# Patient Record
Sex: Male | Born: 1950 | Race: White | Hispanic: No | Marital: Married | Smoking: Former smoker
Health system: Southern US, Community
[De-identification: ages and names within clinical notes are randomized; demographics above are authoritative.]

## PROBLEM LIST (undated history)

## (undated) DIAGNOSIS — N200 Calculus of kidney: Secondary | ICD-10-CM

## (undated) DIAGNOSIS — G7000 Myasthenia gravis without (acute) exacerbation: Secondary | ICD-10-CM

## (undated) DIAGNOSIS — Z87442 Personal history of urinary calculi: Secondary | ICD-10-CM

## (undated) DIAGNOSIS — C801 Malignant (primary) neoplasm, unspecified: Secondary | ICD-10-CM

## (undated) DIAGNOSIS — Z8719 Personal history of other diseases of the digestive system: Secondary | ICD-10-CM

## (undated) DIAGNOSIS — S7290XA Unspecified fracture of unspecified femur, initial encounter for closed fracture: Secondary | ICD-10-CM

## (undated) DIAGNOSIS — S72009A Fracture of unspecified part of neck of unspecified femur, initial encounter for closed fracture: Secondary | ICD-10-CM

## (undated) DIAGNOSIS — B084 Enteroviral vesicular stomatitis with exanthem: Secondary | ICD-10-CM

## (undated) DIAGNOSIS — R531 Weakness: Secondary | ICD-10-CM

## (undated) DIAGNOSIS — B0229 Other postherpetic nervous system involvement: Secondary | ICD-10-CM

## (undated) DIAGNOSIS — B029 Zoster without complications: Secondary | ICD-10-CM

## (undated) DIAGNOSIS — T603X1A Toxic effect of herbicides and fungicides, accidental (unintentional), initial encounter: Secondary | ICD-10-CM

## (undated) HISTORY — DX: Toxic effect of herbicides and fungicides, accidental (unintentional), initial encounter: T60.3X1A

## (undated) HISTORY — DX: Weakness: R53.1

## (undated) HISTORY — PX: ESOPHAGOGASTRODUODENOSCOPY: SHX1529

## (undated) HISTORY — DX: Fracture of unspecified part of neck of unspecified femur, initial encounter for closed fracture: S72.009A

## (undated) HISTORY — DX: Other postherpetic nervous system involvement: B02.29

## (undated) HISTORY — DX: Calculus of kidney: N20.0

## (undated) HISTORY — PX: COLONOSCOPY: SHX174

## (undated) HISTORY — DX: Myasthenia gravis without (acute) exacerbation: G70.00

## (undated) HISTORY — DX: Unspecified fracture of unspecified femur, initial encounter for closed fracture: S72.90XA

## (undated) HISTORY — PX: CATARACT EXTRACTION, BILATERAL: SHX1313

## (undated) HISTORY — DX: Enteroviral vesicular stomatitis with exanthem: B08.4

---

## 1976-09-05 HISTORY — PX: TONSILLECTOMY: SUR1361

## 1978-09-05 DIAGNOSIS — T603X1A Toxic effect of herbicides and fungicides, accidental (unintentional), initial encounter: Secondary | ICD-10-CM

## 1978-09-05 HISTORY — DX: Toxic effect of herbicides and fungicides, accidental (unintentional), initial encounter: T60.3X1A

## 1979-09-06 HISTORY — PX: LUNG LOBECTOMY: SHX167

## 1997-09-05 HISTORY — PX: HERNIA REPAIR: SHX51

## 2011-05-15 ENCOUNTER — Ambulatory Visit: Payer: Self-pay | Admitting: Ophthalmology

## 2013-06-07 ENCOUNTER — Other Ambulatory Visit: Payer: Self-pay | Admitting: Family Medicine

## 2013-06-07 LAB — CBC WITH DIFFERENTIAL/PLATELET
Eosinophil %: 3.8 %
HCT: 45.7 % (ref 40.0–52.0)
HGB: 15.8 g/dL (ref 13.0–18.0)
Lymphocyte #: 1.6 10*3/uL (ref 1.0–3.6)
Lymphocyte %: 24.3 %
MCV: 92 fL (ref 80–100)
Neutrophil %: 54.5 %
Platelet: 206 10*3/uL (ref 150–440)
RBC: 4.99 10*6/uL (ref 4.40–5.90)
RDW: 14.4 % (ref 11.5–14.5)
WBC: 6.5 10*3/uL (ref 3.8–10.6)

## 2013-10-06 DIAGNOSIS — B029 Zoster without complications: Secondary | ICD-10-CM

## 2013-10-06 HISTORY — DX: Zoster without complications: B02.9

## 2013-11-03 DIAGNOSIS — S72009A Fracture of unspecified part of neck of unspecified femur, initial encounter for closed fracture: Secondary | ICD-10-CM

## 2013-11-03 DIAGNOSIS — S7290XA Unspecified fracture of unspecified femur, initial encounter for closed fracture: Secondary | ICD-10-CM

## 2013-11-03 HISTORY — DX: Fracture of unspecified part of neck of unspecified femur, initial encounter for closed fracture: S72.009A

## 2013-11-03 HISTORY — DX: Unspecified fracture of unspecified femur, initial encounter for closed fracture: S72.90XA

## 2014-10-16 ENCOUNTER — Other Ambulatory Visit: Payer: Self-pay | Admitting: Orthopaedic Surgery

## 2014-11-05 ENCOUNTER — Other Ambulatory Visit (HOSPITAL_COMMUNITY): Payer: Self-pay | Admitting: *Deleted

## 2014-11-05 NOTE — Pre-Procedure Instructions (Signed)
JAQUIN COY  11/05/2014   Your procedure is scheduled on:  Tuesday, November 18, 2014 at 7:30 AM.   Report to [facility] Entrance "A" Admitting Office at 5:30 AM.   Call this number if you have problems the morning of surgery: [phone]               Any questions prior to day of surgery, please call [phone] between 8 & 4 PM.   Remember:   Do not eat food or drink liquids after midnight Monday, 11/17/14.   Take these medicines the morning of surgery with A SIP OF WATER:    Do not wear jewelry.  Do not wear lotions, powders, or cologne. You may wear deodorant.  Men may shave face and neck.  Do not bring valuables to the hospital.  [facility] is not responsible                  for any belongings or valuables.               Contacts, dentures or bridgework may not be worn into surgery.  Leave suitcase in the car. After surgery it may be brought to your room.  For patients admitted to the hospital, discharge time is determined by your                treatment team.             Special Instructions: See "Preparing for Surgery" Instruction Sheet   Please read over the following fact sheets that you were given: Pain Booklet, Coughing and Deep Breathing, Blood Transfusion Information, MRSA Information and Surgical Site Infection Prevention

## 2014-11-06 ENCOUNTER — Ambulatory Visit (HOSPITAL_COMMUNITY)
Admission: RE | Admit: 2014-11-06 | Discharge: 2014-11-06 | Disposition: A | Discharge status: Home / Self Care | Payer: Worker's Compensation | Source: Ambulatory Visit | Attending: Orthopaedic Surgery | Admitting: Orthopaedic Surgery

## 2014-11-06 ENCOUNTER — Encounter (HOSPITAL_COMMUNITY)
Admission: RE | Admit: 2014-11-06 | Discharge: 2014-11-06 | Disposition: A | Discharge status: Home / Self Care | Payer: Worker's Compensation | Source: Ambulatory Visit | Attending: Orthopaedic Surgery | Admitting: Orthopaedic Surgery

## 2014-11-06 ENCOUNTER — Encounter (HOSPITAL_COMMUNITY): Payer: Self-pay

## 2014-11-06 DIAGNOSIS — Z01812 Encounter for preprocedural laboratory examination: Secondary | ICD-10-CM | POA: Insufficient documentation

## 2014-11-06 DIAGNOSIS — M1611 Unilateral primary osteoarthritis, right hip: Secondary | ICD-10-CM | POA: Diagnosis not present

## 2014-11-06 DIAGNOSIS — Z01810 Encounter for preprocedural cardiovascular examination: Secondary | ICD-10-CM | POA: Insufficient documentation

## 2014-11-06 DIAGNOSIS — J984 Other disorders of lung: Secondary | ICD-10-CM | POA: Diagnosis not present

## 2014-11-06 DIAGNOSIS — Z01818 Encounter for other preprocedural examination: Secondary | ICD-10-CM | POA: Insufficient documentation

## 2014-11-06 DIAGNOSIS — Z0183 Encounter for blood typing: Secondary | ICD-10-CM | POA: Diagnosis not present

## 2014-11-06 HISTORY — DX: Zoster without complications: B02.9

## 2014-11-06 HISTORY — DX: Calculus of kidney: N20.0

## 2014-11-06 HISTORY — DX: Personal history of other diseases of the digestive system: Z87.19

## 2014-11-06 LAB — URINALYSIS, ROUTINE W REFLEX MICROSCOPIC
Bilirubin Urine: NEGATIVE
Glucose, UA: NEGATIVE mg/dL
KETONES UR: NEGATIVE mg/dL
LEUKOCYTES UA: NEGATIVE
Nitrite: NEGATIVE
PROTEIN: NEGATIVE mg/dL
Specific Gravity, Urine: 1.009 (ref 1.005–1.030)
Urobilinogen, UA: 0.2 mg/dL (ref 0.0–1.0)
pH: 6.5 (ref 5.0–8.0)

## 2014-11-06 LAB — CBC WITH DIFFERENTIAL/PLATELET
BASOS ABS: 0 10*3/uL (ref 0.0–0.1)
BASOS PCT: 0 % (ref 0–1)
EOS ABS: 0.2 10*3/uL (ref 0.0–0.7)
EOS PCT: 3 % (ref 0–5)
HCT: 44.3 % (ref 39.0–52.0)
Hemoglobin: 15 g/dL (ref 13.0–17.0)
LYMPHS PCT: 19 % (ref 12–46)
Lymphs Abs: 1.3 10*3/uL (ref 0.7–4.0)
MCH: 31.3 pg (ref 26.0–34.0)
MCHC: 33.9 g/dL (ref 30.0–36.0)
MCV: 92.3 fL (ref 78.0–100.0)
Monocytes Absolute: 0.7 10*3/uL (ref 0.1–1.0)
Monocytes Relative: 11 % (ref 3–12)
Neutro Abs: 4.6 10*3/uL (ref 1.7–7.7)
Neutrophils Relative %: 67 % (ref 43–77)
PLATELETS: 248 10*3/uL (ref 150–400)
RBC: 4.8 MIL/uL (ref 4.22–5.81)
RDW: 13.4 % (ref 11.5–15.5)
WBC: 6.8 10*3/uL (ref 4.0–10.5)

## 2014-11-06 LAB — URINE MICROSCOPIC-ADD ON

## 2014-11-06 LAB — BASIC METABOLIC PANEL
ANION GAP: 4 — AB (ref 5–15)
BUN: 7 mg/dL (ref 6–23)
CALCIUM: 8.9 mg/dL (ref 8.4–10.5)
CO2: 29 mmol/L (ref 19–32)
CREATININE: 0.83 mg/dL (ref 0.50–1.35)
Chloride: 105 mmol/L (ref 96–112)
GFR calc non Af Amer: >90 mL/min (ref >90)
Glucose, Bld: 84 mg/dL (ref 70–99)
Potassium: 3.8 mmol/L (ref 3.5–5.1)
Sodium: 138 mmol/L (ref 135–145)

## 2014-11-06 LAB — ABO/RH: ABO/RH(D): AB POS

## 2014-11-06 LAB — PROTIME-INR
INR: 1.02 (ref 0.00–1.49)
PROTHROMBIN TIME: 13.5 s (ref 11.6–15.2)

## 2014-11-06 LAB — TYPE AND SCREEN
ABO/RH(D): AB POS
Antibody Screen: NEGATIVE

## 2014-11-06 LAB — APTT: APTT: 30 s (ref 24–37)

## 2014-11-06 LAB — SURGICAL PCR SCREEN
MRSA, PCR: NEGATIVE
STAPHYLOCOCCUS AUREUS: POSITIVE — AB

## 2014-11-06 NOTE — Progress Notes (Addendum)
PCP: Dr. Sheran Spine in [place], [place]  Pt. States last time he saw Pulmonologist was 7-8 yrs ago and it was Dr. Thressa Sheller in the old [facility] hospital. States his primary MD is the only one he has been seeing.  Pt. Will watch [facility] in pre-admission today.  Mupirocin ointment called in to [facility] in [place], [place]

## 2014-11-06 NOTE — Pre-Procedure Instructions (Signed)
JERMALE CRASS  11/06/2014   Your procedure is scheduled on:  Tuesday, November 18, 2014 at 7:30 AM.   Report to [facility] Entrance "A" Admitting Office at 5:30 AM.   Call this number if you have problems the morning of surgery: [phone]               Any questions prior to day of surgery, please call [phone] between 8 & 4 PM.   Remember:   Do not eat food or drink liquids after midnight Monday, 11/17/14.   Take these medicines the morning of surgery with A SIP OF WATER: tramadol if needed               Stop aleve  On March 8   Do not wear jewelry.  Do not wear lotions, powders, or cologne. You may wear deodorant.  Men may shave face and neck.  Do not bring valuables to the hospital.  [facility] is not responsible                  for any belongings or valuables.               Contacts, dentures or bridgework may not be worn into surgery.  Leave suitcase in the car. After surgery it may be brought to your room.  For patients admitted to the hospital, discharge time is determined by your                treatment team.             Special Instructions: See "Preparing for Surgery" Instruction Sheet   Please read over the following fact sheets that you were given: Pain Booklet, Coughing and Deep Breathing, Blood Transfusion Information, MRSA Information and Surgical Site Infection Prevention
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
## 2014-11-17 MED ORDER — CEFAZOLIN SODIUM-DEXTROSE 2-3 GM-% IV SOLR
2.0000 g | INTRAVENOUS | Status: AC
  Administered 2014-11-18: 2 g via INTRAVENOUS
  Filled 2014-11-17: qty 50

## 2014-11-17 MED ORDER — CHLORHEXIDINE GLUCONATE 4 % EX LIQD
60.0000 mL | Freq: Once | CUTANEOUS | Status: DC
  Filled 2014-11-17: qty 60

## 2014-11-18 ENCOUNTER — Inpatient Hospital Stay (HOSPITAL_COMMUNITY): Payer: Worker's Compensation | Admitting: Anesthesiology

## 2014-11-18 ENCOUNTER — Encounter (HOSPITAL_COMMUNITY)
Admission: RE | Disposition: A | Discharge status: Home Health | Payer: Self-pay | Source: Ambulatory Visit | Attending: Orthopaedic Surgery

## 2014-11-18 ENCOUNTER — Inpatient Hospital Stay (HOSPITAL_COMMUNITY)
Admission: RE | Admit: 2014-11-18 | Discharge: 2014-11-20 | DRG: 470 | Disposition: A | Discharge status: Home Health | Payer: Worker's Compensation | Source: Ambulatory Visit | Attending: Orthopaedic Surgery | Admitting: Orthopaedic Surgery

## 2014-11-18 ENCOUNTER — Inpatient Hospital Stay (HOSPITAL_COMMUNITY): Payer: Worker's Compensation | Admitting: Vascular Surgery

## 2014-11-18 ENCOUNTER — Inpatient Hospital Stay (HOSPITAL_COMMUNITY): Payer: Worker's Compensation

## 2014-11-18 ENCOUNTER — Encounter (HOSPITAL_COMMUNITY): Payer: Self-pay | Admitting: *Deleted

## 2014-11-18 DIAGNOSIS — Z7982 Long term (current) use of aspirin: Secondary | ICD-10-CM

## 2014-11-18 DIAGNOSIS — M25551 Pain in right hip: Secondary | ICD-10-CM | POA: Diagnosis present

## 2014-11-18 DIAGNOSIS — M1651 Unilateral post-traumatic osteoarthritis, right hip: Principal | ICD-10-CM | POA: Diagnosis present

## 2014-11-18 DIAGNOSIS — Z419 Encounter for procedure for purposes other than remedying health state, unspecified: Secondary | ICD-10-CM

## 2014-11-18 DIAGNOSIS — Z96649 Presence of unspecified artificial hip joint: Secondary | ICD-10-CM

## 2014-11-18 DIAGNOSIS — M1611 Unilateral primary osteoarthritis, right hip: Secondary | ICD-10-CM | POA: Diagnosis present

## 2014-11-18 HISTORY — PX: TOTAL HIP ARTHROPLASTY: SHX124

## 2014-11-18 SURGERY — ARTHROPLASTY, HIP, TOTAL, ANTERIOR APPROACH
Anesthesia: Spinal | Site: Hip | Laterality: Right

## 2014-11-18 MED ORDER — ALUM & MAG HYDROXIDE-SIMETH 200-200-20 MG/5ML PO SUSP: 30.0000 mL | ORAL | Status: DC | PRN

## 2014-11-18 MED ORDER — PHENYLEPHRINE 40 MCG/ML (10ML) SYRINGE FOR IV PUSH (FOR BLOOD PRESSURE SUPPORT)
PREFILLED_SYRINGE | INTRAVENOUS | Status: AC
  Filled 2014-11-18: qty 10

## 2014-11-18 MED ORDER — SODIUM CHLORIDE 0.9 % IR SOLN
Status: DC | PRN
  Administered 2014-11-18: 1000 mL

## 2014-11-18 MED ORDER — HYDROMORPHONE HCL 1 MG/ML IJ SOLN
0.5000 mg | INTRAMUSCULAR | Status: DC | PRN
  Administered 2014-11-18 – 2014-11-20 (×7): 1 mg via INTRAVENOUS
  Filled 2014-11-18 (×7): qty 1

## 2014-11-18 MED ORDER — METHOCARBAMOL 500 MG PO TABS
500.0000 mg | ORAL_TABLET | Freq: Four times a day (QID) | ORAL | Status: DC | PRN
  Administered 2014-11-18 – 2014-11-20 (×5): 500 mg via ORAL
  Filled 2014-11-18 (×4): qty 1

## 2014-11-18 MED ORDER — ONDANSETRON HCL 4 MG PO TABS: 4.0000 mg | ORAL_TABLET | Freq: Four times a day (QID) | ORAL | Status: DC | PRN

## 2014-11-18 MED ORDER — LACTATED RINGERS IV SOLN
INTRAVENOUS | Status: DC
  Administered 2014-11-18: 13:00:00 via INTRAVENOUS

## 2014-11-18 MED ORDER — MENTHOL 3 MG MT LOZG: 1.0000 | LOZENGE | OROMUCOSAL | Status: DC | PRN

## 2014-11-18 MED ORDER — MIDAZOLAM HCL 5 MG/5ML IJ SOLN
INTRAMUSCULAR | Status: DC | PRN
  Administered 2014-11-18: 2 mg via INTRAVENOUS

## 2014-11-18 MED ORDER — MIDAZOLAM HCL 2 MG/2ML IJ SOLN
INTRAMUSCULAR | Status: AC
  Filled 2014-11-18: qty 2

## 2014-11-18 MED ORDER — DEXTROSE 5 % IV SOLN
500.0000 mg | INTRAVENOUS | Status: DC
  Filled 2014-11-18: qty 5

## 2014-11-18 MED ORDER — ACETAMINOPHEN 325 MG PO TABS: 650.0000 mg | ORAL_TABLET | Freq: Four times a day (QID) | ORAL | Status: DC | PRN

## 2014-11-18 MED ORDER — LACTATED RINGERS IV SOLN
INTRAVENOUS | Status: DC
  Administered 2014-11-18: 07:00:00 via INTRAVENOUS

## 2014-11-18 MED ORDER — FENTANYL CITRATE 0.05 MG/ML IJ SOLN
INTRAMUSCULAR | Status: DC | PRN
  Administered 2014-11-18: 50 ug via INTRAVENOUS

## 2014-11-18 MED ORDER — ASPIRIN EC 325 MG PO TBEC
325.0000 mg | DELAYED_RELEASE_TABLET | Freq: Two times a day (BID) | ORAL | Status: DC
  Administered 2014-11-19 – 2014-11-20 (×3): 325 mg via ORAL
  Filled 2014-11-18 (×3): qty 1

## 2014-11-18 MED ORDER — BUPIVACAINE HCL (PF) 0.75 % IJ SOLN
INTRAMUSCULAR | Status: DC | PRN
  Administered 2014-11-18: 2 mL via INTRATHECAL

## 2014-11-18 MED ORDER — PHENOL 1.4 % MT LIQD: 1.0000 | OROMUCOSAL | Status: DC | PRN

## 2014-11-18 MED ORDER — ROCURONIUM BROMIDE 50 MG/5ML IV SOLN
INTRAVENOUS | Status: AC
  Filled 2014-11-18: qty 1

## 2014-11-18 MED ORDER — LIDOCAINE HCL (CARDIAC) 20 MG/ML IV SOLN
INTRAVENOUS | Status: AC
  Filled 2014-11-18: qty 5

## 2014-11-18 MED ORDER — ONDANSETRON HCL 4 MG/2ML IJ SOLN
INTRAMUSCULAR | Status: AC
  Filled 2014-11-18: qty 2

## 2014-11-18 MED ORDER — METOCLOPRAMIDE HCL 5 MG PO TABS
5.0000 mg | ORAL_TABLET | Freq: Three times a day (TID) | ORAL | Status: DC | PRN
  Filled 2014-11-18: qty 2

## 2014-11-18 MED ORDER — SODIUM CHLORIDE 0.9 % IJ SOLN
INTRAMUSCULAR | Status: AC
  Filled 2014-11-18: qty 10

## 2014-11-18 MED ORDER — ONDANSETRON HCL 4 MG/2ML IJ SOLN: 4.0000 mg | Freq: Once | INTRAMUSCULAR | Status: DC | PRN

## 2014-11-18 MED ORDER — PHENYLEPHRINE HCL 10 MG/ML IJ SOLN
INTRAMUSCULAR | Status: DC | PRN
  Administered 2014-11-18 (×3): 80 ug via INTRAVENOUS

## 2014-11-18 MED ORDER — ONDANSETRON HCL 4 MG/2ML IJ SOLN: 4.0000 mg | Freq: Four times a day (QID) | INTRAMUSCULAR | Status: DC | PRN

## 2014-11-18 MED ORDER — BUPIVACAINE IN DEXTROSE 0.75-8.25 % IT SOLN
INTRATHECAL | Status: DC | PRN
  Administered 2014-11-18: 2 mL via INTRATHECAL

## 2014-11-18 MED ORDER — DIPHENHYDRAMINE HCL 12.5 MG/5ML PO ELIX: 12.5000 mg | ORAL_SOLUTION | ORAL | Status: DC | PRN

## 2014-11-18 MED ORDER — ACETAMINOPHEN 650 MG RE SUPP: 650.0000 mg | Freq: Four times a day (QID) | RECTAL | Status: DC | PRN

## 2014-11-18 MED ORDER — HYDROMORPHONE HCL 1 MG/ML IJ SOLN
INTRAMUSCULAR | Status: AC
  Filled 2014-11-18: qty 1

## 2014-11-18 MED ORDER — CEFAZOLIN SODIUM-DEXTROSE 2-3 GM-% IV SOLR
2.0000 g | Freq: Once | INTRAVENOUS | Status: AC
  Administered 2014-11-18: 2 g via INTRAVENOUS
  Filled 2014-11-18: qty 50

## 2014-11-18 MED ORDER — HYDROMORPHONE HCL 1 MG/ML IJ SOLN: 0.2500 mg | INTRAMUSCULAR | Status: DC | PRN

## 2014-11-18 MED ORDER — METHOCARBAMOL 1000 MG/10ML IJ SOLN
500.0000 mg | Freq: Four times a day (QID) | INTRAVENOUS | Status: DC | PRN
  Filled 2014-11-18: qty 5

## 2014-11-18 MED ORDER — HYDROCODONE-ACETAMINOPHEN 5-325 MG PO TABS
1.0000 | ORAL_TABLET | ORAL | Status: DC | PRN
  Administered 2014-11-18 – 2014-11-20 (×10): 2 via ORAL
  Filled 2014-11-18 (×9): qty 2

## 2014-11-18 MED ORDER — METOCLOPRAMIDE HCL 5 MG/ML IJ SOLN: 5.0000 mg | Freq: Three times a day (TID) | INTRAMUSCULAR | Status: DC | PRN

## 2014-11-18 MED ORDER — FENTANYL CITRATE 0.05 MG/ML IJ SOLN
INTRAMUSCULAR | Status: AC
  Filled 2014-11-18: qty 5

## 2014-11-18 MED ORDER — PROPOFOL INFUSION 10 MG/ML OPTIME
INTRAVENOUS | Status: DC | PRN
  Administered 2014-11-18: 100 ug/kg/min via INTRAVENOUS

## 2014-11-18 MED ORDER — MEPERIDINE HCL 25 MG/ML IJ SOLN: 6.2500 mg | INTRAMUSCULAR | Status: DC | PRN

## 2014-11-18 MED ORDER — DOCUSATE SODIUM 100 MG PO CAPS
100.0000 mg | ORAL_CAPSULE | Freq: Two times a day (BID) | ORAL | Status: DC
  Administered 2014-11-18 – 2014-11-20 (×4): 100 mg via ORAL
  Filled 2014-11-18 (×4): qty 1

## 2014-11-18 MED ORDER — EPHEDRINE SULFATE 50 MG/ML IJ SOLN
INTRAMUSCULAR | Status: AC
  Filled 2014-11-18: qty 1

## 2014-11-18 MED ORDER — METHOCARBAMOL 500 MG PO TABS
ORAL_TABLET | ORAL | Status: AC
  Filled 2014-11-18: qty 1

## 2014-11-18 MED ORDER — PROPOFOL 10 MG/ML IV BOLUS
INTRAVENOUS | Status: AC
  Filled 2014-11-18: qty 20

## 2014-11-18 MED ORDER — TRANEXAMIC ACID 100 MG/ML IV SOLN
1000.0000 mg | INTRAVENOUS | Status: AC
  Administered 2014-11-18: 1000 mg via INTRAVENOUS
  Filled 2014-11-18: qty 10

## 2014-11-18 MED ORDER — HYDROCODONE-ACETAMINOPHEN 5-325 MG PO TABS
ORAL_TABLET | ORAL | Status: AC
  Filled 2014-11-18: qty 2

## 2014-11-18 SURGICAL SUPPLY — 48 items
BLADE SAW SGTL 18X1.27X75 (BLADE) ×2 IMPLANT
BLADE SAW SGTL 18X1.27X75MM (BLADE) ×1
BLADE SURG ROTATE 9660 (MISCELLANEOUS) IMPLANT
CAPT HIP TOTAL 2 ×3 IMPLANT
CELLS DAT CNTRL 66122 CELL SVR (MISCELLANEOUS) ×1 IMPLANT
COVER PERINEAL POST (MISCELLANEOUS) ×3 IMPLANT
COVER SURGICAL LIGHT HANDLE (MISCELLANEOUS) ×3 IMPLANT
DRAPE C-ARM 42X72 X-RAY (DRAPES) ×3 IMPLANT
DRAPE IMP U-DRAPE 54X76 (DRAPES) ×3 IMPLANT
DRAPE STERI IOBAN 125X83 (DRAPES) ×6 IMPLANT
DRAPE U-SHAPE 47X51 STRL (DRAPES) ×9 IMPLANT
DRSG AQUACEL AG ADV 3.5X10 (GAUZE/BANDAGES/DRESSINGS) ×3 IMPLANT
DURAPREP 26ML APPLICATOR (WOUND CARE) ×3 IMPLANT
ELECT BLADE 4.0 EZ CLEAN MEGAD (MISCELLANEOUS)
ELECT CAUTERY BLADE 6.4 (BLADE) ×3 IMPLANT
ELECT REM PT RETURN 9FT ADLT (ELECTROSURGICAL) ×3
ELECTRODE BLDE 4.0 EZ CLN MEGD (MISCELLANEOUS) IMPLANT
ELECTRODE REM PT RTRN 9FT ADLT (ELECTROSURGICAL) ×1 IMPLANT
FACESHIELD WRAPAROUND (MASK) ×12 IMPLANT
GLOVE BIO SURGEON STRL SZ8 (GLOVE) ×15 IMPLANT
GLOVE BIOGEL PI IND STRL 8 (GLOVE) ×2 IMPLANT
GLOVE BIOGEL PI INDICATOR 8 (GLOVE) ×4
GOWN STRL REUS W/ TWL LRG LVL3 (GOWN DISPOSABLE) ×1 IMPLANT
GOWN STRL REUS W/ TWL XL LVL3 (GOWN DISPOSABLE) ×2 IMPLANT
GOWN STRL REUS W/TWL LRG LVL3 (GOWN DISPOSABLE) ×2
GOWN STRL REUS W/TWL XL LVL3 (GOWN DISPOSABLE) ×4
KIT BASIN OR (CUSTOM PROCEDURE TRAY) ×3 IMPLANT
KIT ROOM TURNOVER OR (KITS) ×3 IMPLANT
LINER BOOT UNIVERSAL DISP (MISCELLANEOUS) ×3 IMPLANT
MANIFOLD NEPTUNE II (INSTRUMENTS) ×3 IMPLANT
NS IRRIG 1000ML POUR BTL (IV SOLUTION) ×3 IMPLANT
PACK TOTAL JOINT (CUSTOM PROCEDURE TRAY) ×3 IMPLANT
PACK UNIVERSAL I (CUSTOM PROCEDURE TRAY) ×3 IMPLANT
PAD ARMBOARD 7.5X6 YLW CONV (MISCELLANEOUS) ×6 IMPLANT
RTRCTR WOUND ALEXIS 18CM MED (MISCELLANEOUS) ×3
STAPLER VISISTAT 35W (STAPLE) ×3 IMPLANT
SUT ETHIBOND NAB CT1 #1 30IN (SUTURE) ×9 IMPLANT
SUT VIC AB 0 CT1 27 (SUTURE)
SUT VIC AB 0 CT1 27XBRD ANBCTR (SUTURE) IMPLANT
SUT VIC AB 1 CT1 27 (SUTURE) ×2
SUT VIC AB 1 CT1 27XBRD ANBCTR (SUTURE) ×1 IMPLANT
SUT VIC AB 2-0 CT1 27 (SUTURE) ×2
SUT VIC AB 2-0 CT1 TAPERPNT 27 (SUTURE) ×1 IMPLANT
SUT VLOC 180 0 24IN GS25 (SUTURE) ×3 IMPLANT
TOWEL OR 17X24 6PK STRL BLUE (TOWEL DISPOSABLE) ×3 IMPLANT
TOWEL OR 17X26 10 PK STRL BLUE (TOWEL DISPOSABLE) ×6 IMPLANT
TRAY FOLEY CATH 14FR (SET/KITS/TRAYS/PACK) IMPLANT
WATER STERILE IRR 1000ML POUR (IV SOLUTION) ×6 IMPLANT

## 2014-11-18 NOTE — Anesthesia Procedure Notes (Addendum)
Spinal Patient location during procedure: OR Start time: 11/18/2014 7:46 AM End time: 11/18/2014 7:52 AM Staffing Anesthesiologist: Kaydince Towles Spinal Block Patient position: sitting Prep: Betadine Patient monitoring: heart rate, cardiac monitor, continuous pulse ox and blood pressure Approach: right paramedian Location: L3-4 Injection technique: single-shot Needle Needle type: Whitacre and Introducer  Needle gauge: 25 G Needle length: 9 cm Assessment Sensory level: T8 Additional Notes CSF was obtained and was clear in all 4 quadrants.  2.0 ml of 0.75% Spinal Marcaine was given slowly after aspiration of CSF.  Procedure Name: MAC Date/Time: 11/18/2014 7:54 AM Performed by: Kyung Rudd Pre-anesthesia Checklist: Patient identified, Emergency Drugs available, Suction available, Patient being monitored and Timeout performed Patient Re-evaluated:Patient Re-evaluated prior to inductionOxygen Delivery Method: Simple face mask Intubation Type: IV induction Placement Confirmation: positive ETCO2

## 2014-11-18 NOTE — Transfer of Care (Signed)
Immediate Anesthesia Transfer of Care Note  Patient: Francisco Braun  Procedure(s) Performed: Procedure(s): TOTAL HIP ARTHROPLASTY ANTERIOR APPROACH (Right)  Patient Location: PACU  Anesthesia Type:Spinal  Level of Consciousness: awake, alert  and oriented  Airway & Oxygen Therapy: Patient Spontanous Breathing and Patient connected to face mask oxygen  Post-op Assessment: Report given to RN and Post -op Vital signs reviewed and stable  Post vital signs: Reviewed and stable  Last Vitals:  Filed Vitals:   11/18/14 0626  BP: 129/74  Pulse: 72  Temp: 36.7 C  Resp: 20    Complications: No apparent anesthesia complications

## 2014-11-18 NOTE — Addendum Note (Signed)
Addendum  created 11/18/14 2009 by Lorrene Reid, MD   Modules edited: Anesthesia Blocks and Procedures, Anesthesia Medication Administration, Clinical Notes   Clinical Notes:  File: [id number]; File: [id number]
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
## 2014-11-18 NOTE — Anesthesia Postprocedure Evaluation (Addendum)
Anesthesia Post Note  Patient: Francisco Braun  Procedure(s) Performed: Procedure(s) (LRB): TOTAL HIP ARTHROPLASTY ANTERIOR APPROACH (Right)  Anesthesia type: spinal  Patient location: PACU  Post pain: Pain level controlled  Post assessment: Patient's Cardiovascular Status Stable  Last Vitals:  Filed Vitals:   11/18/14 1248  BP: 94/59  Pulse: 62  Temp: 36.4 C  Resp: 16    Post vital signs: Reviewed and stable  Level of consciousness: awake  Complications: No apparent anesthesia complications

## 2014-11-18 NOTE — Progress Notes (Signed)
UR complete.  Mandalyn Pasqua RN, MSN 

## 2014-11-18 NOTE — Progress Notes (Signed)
Moved to ppacu holding #1 pending bed spinal precautions to d/c to floor

## 2014-11-18 NOTE — Evaluation (Signed)
Physical Therapy Evaluation Patient Details Name: Francisco Braun MRN: [id number] DOB: 10/02/1950 Today's Date: 11/18/2014   History of Present Illness  pt presents with R THA.  Hx Hiatal Hernia repair.    Clinical Impression  Pt moving well despite having diminished sensation post-op.  Pt does endorse nausea during mobility and feeling "medicine head".  Feel pt will make great progress to return to home with his wife.      Follow Up Recommendations Home health PT;Supervision - Intermittent    Equipment Recommendations  None recommended by PT    Recommendations for Other Services       Precautions / Restrictions Precautions Precautions: None Restrictions Weight Bearing Restrictions: Yes RLE Weight Bearing: Weight bearing as tolerated      Mobility  Bed Mobility Overal bed mobility: Needs Assistance Bed Mobility: Supine to Sit     Supine to sit: Min assist     General bed mobility comments: A with R LE only.  pt indicates feeling "medicine head" upon coming to sitting.    Transfers Overall transfer level: Needs assistance Equipment used: Rolling walker (2 wheeled) Transfers: Sit to/from [facility] Sit to Stand: Min guard Stand pivot transfers: Min guard       General transfer comment: cuesf or safe technique and encouragement.  pt continues to indicate medicine head feeling, but states it's ok when asked how bad.    Ambulation/Gait                Stairs            Wheelchair Mobility    Modified Rankin (Stroke Patients Only)       Balance Overall balance assessment: No apparent balance deficits (not formally assessed)                                           Pertinent Vitals/Pain Pain Assessment: 0-10 Pain Score: 5  Pain Location: R hip Pain Descriptors / Indicators: Aching;Burning Pain Intervention(s): Monitored during session;Premedicated before session;Repositioned    Home Living Family/patient  expects to be discharged to:: Private residence Living Arrangements: Spouse/significant other Available Help at Discharge: Family;Available 24 hours/day Type of Home: House Home Access: Stairs to enter Entrance Stairs-Rails: Right;Left;Can reach both Entrance Stairs-Number of Steps: 2 Home Layout: Two level;Able to live on main level with bedroom/bathroom Home Equipment: Gilford Rile - 2 wheels;Bedside commode;Cane - single point      Prior Function Level of Independence: Independent with assistive device(s)               Hand Dominance        Extremity/Trunk Assessment   Upper Extremity Assessment: Defer to OT evaluation           Lower Extremity Assessment: RLE deficits/detail RLE Deficits / Details: ROM WFL, but strength limited by pain.  pt sensation still diminished post-op.      Cervical / Trunk Assessment: Normal  Communication   Communication: No difficulties  Cognition Arousal/Alertness: Awake/alert Behavior During Therapy: WFL for tasks assessed/performed Overall Cognitive Status: Within Functional Limits for tasks assessed                      General Comments      Exercises Total Joint Exercises Ankle Circles/Pumps: AROM;Both;10 reps Quad Sets: AROM;Right;10 reps Heel Slides: AROM;Right;10 reps (Limited ROM) Long Arc Quad: AROM;Right;5 reps (pt began to  feel nauseated during LAQs.)      Assessment/Plan    PT Assessment Patient needs continued PT services  PT Diagnosis Abnormality of gait;Acute pain   PT Problem List Decreased strength;Decreased activity tolerance;Decreased balance;Decreased mobility;Decreased coordination;Decreased knowledge of use of DME;Pain  PT Treatment Interventions DME instruction;Stair training;Gait training;Functional mobility training;Therapeutic activities;Therapeutic exercise;Balance training;Patient/family education   PT Goals (Current goals can be found in the Care Plan section) Acute Rehab PT Goals Patient  Stated Goal: Back to work PT Goal Formulation: With patient Time For Goal Achievement: 11/25/14 Potential to Achieve Goals: Good    Frequency 7X/week   Barriers to discharge        Co-evaluation               End of Session Equipment Utilized During Treatment: Gait belt Activity Tolerance:  (Limited by nausea.  ) Patient left: in chair;with call bell/phone within reach;with family/visitor present Nurse Communication: Mobility status         Time: 8325-4982 PT Time Calculation (min) (ACUTE ONLY): 48 min   Charges:   PT Evaluation $Initial PT Evaluation Tier I: 1 Procedure PT Treatments $Gait Training: 8-22 mins $Therapeutic Exercise: 8-22 mins   PT G CodesCatarina Hartshorn, [place] [phone] 11/18/2014, 3:27 PM

## 2014-11-18 NOTE — Addendum Note (Signed)
Addendum  created 11/18/14 2015 by Lorrene Reid, MD   Modules edited: Anesthesia Medication Administration

## 2014-11-18 NOTE — Anesthesia Preprocedure Evaluation (Addendum)
Anesthesia Evaluation  Patient identified by MRN, date of birth, ID band Patient awake    Reviewed: Allergy & Precautions, NPO status , Patient's Chart, lab work & pertinent test results  Airway Mallampati: I  TM Distance: >3 FB Neck ROM: Full    Dental  (+) Teeth Intact, Caps   Pulmonary          Cardiovascular     Neuro/Psych    GI/Hepatic   Endo/Other    Renal/GU      Musculoskeletal   Abdominal   Peds  Hematology   Anesthesia Other Findings   Reproductive/Obstetrics                            Anesthesia Physical Anesthesia Plan  ASA: II  Anesthesia Plan: Spinal   Post-op Pain Management:    Induction: Intravenous  Airway Management Planned: Simple Face Mask  Additional Equipment:   Intra-op Plan:   Post-operative Plan:   Informed Consent: I have reviewed the patients History and Physical, chart, labs and discussed the procedure including the risks, benefits and alternatives for the proposed anesthesia with the patient or authorized representative who has indicated his/her understanding and acceptance.     Plan Discussed with: CRNA and Surgeon  Anesthesia Plan Comments:        Anesthesia Quick Evaluation

## 2014-11-18 NOTE — Interval H&P Note (Signed)
OK for surgery PD 

## 2014-11-19 ENCOUNTER — Encounter (HOSPITAL_COMMUNITY): Payer: Self-pay | Admitting: Orthopaedic Surgery

## 2014-11-19 NOTE — Plan of Care (Signed)
Problem: Consults Goal: Diagnosis- Total Joint Replacement Primary Total Hip     

## 2014-11-19 NOTE — Progress Notes (Signed)
Subjective: 1 Day Post-Op Procedure(s) (LRB): TOTAL HIP ARTHROPLASTY ANTERIOR APPROACH (Right)  Activity level:  May be out of bed with therapy.  Weightbearing as tolerated no hip precautions.  Most likely discharged home tomorrow with home health care   Diet tolerance:  Regular diet as tolerated Voiding: voiding on her own Patient reports pain as 9 on 0-10 scale.    Objective: Vital signs in last 24 hours: Temp:  [97.7 F (36.5 C)-99 F (37.2 C)] 99 F (37.2 C) (03/16 0446) Pulse Rate:  [79-90] 88 (03/16 0446) Resp:  [15-17] 16 (03/16 0446) BP: (92-97)/(56-62) 92/57 mmHg (03/16 0446) SpO2:  [95 %-99 %] 95 % (03/16 0446)  Labs: No results for input(s): HGB in the last 72 hours. No results for input(s): WBC, RBC, HCT, PLT in the last 72 hours. No results for input(s): NA, K, CL, CO2, BUN, CREATININE, GLUCOSE, CALCIUM in the last 72 hours. No results for input(s): LABPT, INR in the last 72 hours.  Physical Exam:  Neurologically intact ABD soft Neurovascular intact Sensation intact distally Intact pulses distally Dorsiflexion/Plantar flexion intact Incision: dressing C/D/I No cellulitis present Compartment soft  Assessment/Plan:  1 Day Post-Op Procedure(s) (LRB): TOTAL HIP ARTHROPLASTY ANTERIOR APPROACH (Right) Advance diet Up with therapy D/C IV fluids Plan for discharge tomorrow Discharge home with home health  ASA 325 one by mouth twice a day for DVT prophylaxis x4 weeks. Leave dressing on for 2 weeks we'll change in office    Romano Stigger R 11/19/2014, 12:52 PM

## 2014-11-19 NOTE — Progress Notes (Signed)
11/19/14 Spoke with patient and his wife, workers comp Tourist information centre manager is Lysle Pearl [phone]. Contacted Denise and informed her that patient will need HHPT, OT and RN as well as a rolling walker, 3N1, shower bench and a hip kit. She asked that I fax orders and op note to [phone]. Faxed requested info and received confirmation. Received call from Paint at [facility] [phone], he had received the orders from [facility] and asked for a DME company that the hospital uses. He will be setting up DME and HHC. Burke Keels number for [facility]. Received voicemail from [place] requesting name of hospital contact for [facility]. Spoke with Jermaine at [facility] and then called Elita Quick and left Jermaine's number on [employer]. Will continue to follow.

## 2014-11-19 NOTE — Progress Notes (Signed)
Physical Therapy Treatment Patient Details Name: Francisco Braun MRN: [id number] DOB: 03-30-1951 Today's Date: 11/19/2014    History of Present Illness pt presents with R THA.  Hx Hiatal Hernia repair.      PT Comments    Pt con't to be unable to complete SLR and has difficulty with R hip flexion due to burning lateral hip pain. Pt slowly progressing ambulation tolerance.  Will assess stair navigation later today.  Current plan for d/c to home with home health PT remains appropriate.  Follow Up Recommendations  Home health PT;Supervision - Intermittent     Equipment Recommendations  None recommended by PT    Recommendations for Other Services       Precautions / Restrictions Precautions Precautions: None Precaution Comments: R THA anterior approach Restrictions Weight Bearing Restrictions: Yes RLE Weight Bearing: Weight bearing as tolerated    Mobility  Bed Mobility Overal bed mobility: Needs Assistance Bed Mobility: Supine to Sit;Sit to Supine     Supine to sit: Modified independent (Device/Increase time) Sit to supine: Min assist   General bed mobility comments: Needed help getting leg into bed from sit to supine  Transfers Overall transfer level: Needs assistance Equipment used: Rolling walker (2 wheeled) Transfers: Sit to/from Stand Sit to Stand: Min guard         General transfer comment: v/c's for safe hand placement  Ambulation/Gait Ambulation/Gait assistance: Min guard Ambulation Distance (Feet): 100 Feet Assistive device: Rolling walker (2 wheeled) Gait Pattern/deviations: Antalgic;Decreased stride length;Step-to pattern;Decreased weight shift to right     General Gait Details: Guarded gait pattern due to pain and fear of putting weight on R hip   Stairs            Wheelchair Mobility    Modified Rankin (Stroke Patients Only)       Balance Overall balance assessment: Needs assistance         Standing balance support:  Bilateral upper extremity supported Standing balance-Leahy Scale: Fair Standing balance comment: Decreased WB through R hip                    Cognition Arousal/Alertness: Awake/alert Behavior During Therapy: WFL for tasks assessed/performed Overall Cognitive Status: Within Functional Limits for tasks assessed                      Exercises Total Joint Exercises Quad Sets: AROM;Right;10 reps;Supine Heel Slides: 10 reps;AROM;Supine Hip ABduction/ADduction: 10 reps;Supine;AROM (PT assisted movement) Straight Leg Raises: 10 reps;Right;AROM (PT assisted, unable to initiate movement on his own)    General Comments General comments (skin integrity, edema, etc.): Able to initiate quad set on R, pain localized to IT band on R along with pain at incision line (ant hip)      Pertinent Vitals/Pain Pain Assessment: 0-10 Pain Score: 5  Pain Location: R hip/anterior thigh Pain Descriptors / Indicators: Burning;Sore Pain Intervention(s): Monitored during session    Home Living                      Prior Function            PT Goals (current goals can now be found in the care plan section) Acute Rehab PT Goals Patient Stated Goal: decrease pain Progress towards PT goals: Progressing toward goals    Frequency  7X/week    PT Plan Current plan remains appropriate    Co-evaluation  End of Session Equipment Utilized During Treatment: Gait belt Activity Tolerance: Patient tolerated treatment well Patient left: in bed;with call bell/phone within reach;with family/visitor present     Time: 1224-8250 PT Time Calculation (min) (ACUTE ONLY): 25 min  Charges:                       G Codes:      Quisha Mabie 12/15/2014, 1:36 PM  Lucas Mallow, SPT (student physical therapist) Office phone: [phone]

## 2014-11-19 NOTE — Discharge Instructions (Signed)
No precautions following hip surgery.  Leave dressing on until office visit ASA 325 twice a day x4 weeks. Any sign of infection call the office [phone]

## 2014-11-19 NOTE — Evaluation (Signed)
Occupational Therapy Evaluation Patient Details Name: Francisco Braun MRN: [id number] DOB: [birth year] Today's Date: 11/19/2014    History of Present Illness pt presents with R THA.  Hx Hiatal Hernia repair.     Clinical Impression   Patient mod I PTA. Patient currently functioning an overall min guard for functional mobility/transfers and up to mod assist for LB ADLs. Patient will benefit from acute OT to increase overall independence in the areas of ADLs, functional mobility, and overall safety in order to safely discharge home with wife.     Follow Up Recommendations  No OT follow up;Supervision/Assistance - 24 hour    Equipment Recommendations  3 in 1 bedside comode;Tub/shower bench;Other (comment) (AE - reacher, sock aid, LH sponge, LH shoe horn)    Recommendations for Other Services  None at this time     Precautions / Restrictions Precautions Precautions: None Restrictions Weight Bearing Restrictions: No RLE Weight Bearing: Weight bearing as tolerated      Mobility Bed Mobility   General bed mobility comments: Patient seated EOB eager to transfer to recliner upon OT entering room, please see PT eval for info regarding bed mobility  Transfers Overall transfer level: Needs assistance Equipment used: Rolling walker (2 wheeled) Transfers: Sit to/from [employer] Sit to Stand: Min guard Stand pivot transfers: Min guard General transfer comment: Cues for technique and overall safety, patient limited by pain and seemed somewhat impulsive due to pain    Balance Overall balance assessment: No apparent balance deficits (not formally assessed)    ADL Overall ADL's : Needs assistance/impaired Eating/Feeding: Set up;Sitting   Grooming: Supervision/safety;Sitting   Upper Body Bathing: Set up;Sitting   Lower Body Bathing: Moderate assistance;Sit to/from stand;Cueing for safety   Upper Body Dressing : Set up;Sitting   Lower Body Dressing: Moderate  assistance;Cueing for safety;Sit to/from stand   Toilet Transfer: Min guard;BSC;RW           Functional mobility during ADLs: Min guard;Rolling walker;Cueing for safety General ADL Comments: Patient with increased burning sensation which limited his ability to participate in OT eval. Patient able to reach LLE, but unable to reach RLE. Encouraged patient to work towards this, but for now recommending AE to increase independence. Also recommending BSC and tub transfer bench for use in BR to increase overall independence and overall safety. Patient's wife available to provide 24/7 supervision/assistance post discharge.     Pertinent Vitals/Pain Pain Assessment: 0-10 Pain Score: 9  ("9.5") Pain Location: Right hip Pain Descriptors / Indicators: Burning Pain Intervention(s): Limited activity within patient's tolerance;Monitored during session;Repositioned;Premedicated before session     Hand Dominance Right   Extremity/Trunk Assessment Upper Extremity Assessment Upper Extremity Assessment: Overall WFL for tasks assessed   Lower Extremity Assessment Lower Extremity Assessment: Defer to PT evaluation   Cervical / Trunk Assessment Cervical / Trunk Assessment: Normal   Communication Communication Communication: No difficulties   Cognition Arousal/Alertness: Awake/alert Behavior During Therapy: WFL for tasks assessed/performed Overall Cognitive Status: Within Functional Limits for tasks assessed              Home Living Family/patient expects to be discharged to:: Private residence Living Arrangements: Spouse/significant other Available Help at Discharge: Family;Available 24 hours/day Type of Home: House Home Access: Stairs to enter [employer] of Steps: 2 Entrance Stairs-Rails: Right;Left;Can reach both Home Layout: Two level;Able to live on main level with bedroom/bathroom     Bathroom Shower/Tub: Tub/shower unit;Curtain   Bathroom Toilet: Handicapped  height     Home Equipment:  Cane - single point;Walker - standard    Prior Functioning/Environment Level of Independence: Independent with assistive device(s)     OT Diagnosis: Generalized weakness;Acute pain   OT Problem List: Decreased strength;Decreased activity tolerance;Decreased safety awareness;Decreased knowledge of use of DME or AE;Decreased knowledge of precautions;Pain   OT Treatment/Interventions: Self-care/ADL training;Therapeutic exercise;Energy conservation;Therapeutic activities;Patient/family education    OT Goals(Current goals can be found in the care plan section) Acute Rehab OT Goals Patient Stated Goal: decrease pain OT Goal Formulation: With patient/family Time For Goal Achievement: 11/26/14 Potential to Achieve Goals: Good ADL Goals Pt Will Perform Lower Body Bathing: sit to/from stand;with modified independence;with adaptive equipment Pt Will Perform Lower Body Dressing: with modified independence;sit to/from stand;with adaptive equipment Pt Will Transfer to Toilet: with modified independence;ambulating;bedside commode Pt Will Perform Tub/Shower Transfer: Tub transfer;with modified independence;tub bench;rolling walker;ambulating  OT Frequency: Min 2X/week   Barriers to D/C: None known at this time          End of Session Equipment Utilized During Treatment: Rolling walker  Activity Tolerance: Patient limited by pain Patient left: in chair;with call bell/phone within reach;with family/visitor present   Time: 0805-0822 OT Time Calculation (min): 17 min Charges:  OT General Charges $OT Visit: 1 Procedure OT Evaluation $Initial OT Evaluation Tier I: 1 Procedure  Francisco Braun , MS, OTR/L, CLT Pager: [phone]  11/19/2014, 8:35 AM

## 2014-11-19 NOTE — Progress Notes (Signed)
Physical Therapy Treatment Note   Clinical Impression:  Pt continued to complain of 10/10 burning pain in lateral thigh with WB and ambulation. Ambulation limited by pain. Will assess stair management tomorrow morning.    11/19/14 1600  PT Visit Information  Last PT Received On 11/19/14  Assistance Needed +1  History of Present Illness pt presents with R THA.  Hx Hiatal Hernia repair.    PT Time Calculation  PT Start Time (ACUTE ONLY) 1525  PT Stop Time (ACUTE ONLY) 1551  PT Time Calculation (min) (ACUTE ONLY) 26 min  Subjective Data  Subjective "been doing my exercises"  Patient Stated Goal decrease pain  Precautions  Precautions None  Precaution Comments No precautions direct anterior approach  Restrictions  Weight Bearing Restrictions Yes  RLE Weight Bearing WBAT  Pain Assessment  Pain Assessment 0-10  Pain Score 4  Pain Location R hip/anterior thigh  Pain Descriptors / Indicators Burning  Pain Intervention(s) Monitored during session  Cognition  Arousal/Alertness Awake/alert  Behavior During Therapy WFL for tasks assessed/performed  Overall Cognitive Status Within Functional Limits for tasks assessed  Bed Mobility  Overal bed mobility Modified Independent;Needs Assistance;Independent  Bed Mobility Supine to Sit  Supine to sit Modified independent (Device/Increase time)  Sit to supine Min assist  General bed mobility comments Still needed assist getting leg into bed from sitting EOB  Transfers  Overall transfer level Needs assistance  Equipment used Rolling walker (2 wheeled)  Transfers Sit to/from Stand  Sit to Stand Supervision  General transfer comment provided v/c's for safe hand placement due to tendency to reach for walker  Ambulation/Gait  Ambulation/Gait assistance Min guard  Ambulation Distance (Feet) 100 Feet  Assistive device Rolling walker (2 wheeled)  Gait Pattern/deviations Antalgic;Decreased weight shift to right;Decreased stride length;Step-to  pattern  General Gait Details Decreased LE WB on R due to pain and guarding, needs v/c for WB on R and fluidity of gait  Balance  Overall balance assessment Needs assistance  Standing balance support Bilateral upper extremity supported  Standing balance-Leahy Scale Fair  Standing balance comment Guarding and decrease WB on R hip due to pain  General Comments  General comments (skin integrity, edema, etc.) Immediate pain in lateral thigh when transferred to standing, "burning" pain 10/10 with WB/ambulation, 5/10 when pt was back in bed  Exercises  Exercises Total Joint  Total Joint Exercises  Quad Sets AROM;5 reps;Right;Supine  Heel Slides AROM;5 reps;Supine  Straight Leg Raises 5 reps;AAROM;Supine  PT - End of Session  Equipment Utilized During Treatment Gait belt  Activity Tolerance Patient tolerated treatment well  Patient left in bed;with call bell/phone within reach;with family/visitor present  Nurse Communication Mobility status  PT - Assessment/Plan  PT Plan Current plan remains appropriate  PT Frequency (ACUTE ONLY) 7X/week  Follow Up Recommendations Home health PT;Supervision - Intermittent  PT equipment None recommended by PT  PT Goal Progression  Progress towards PT goals Progressing toward goals   Lucas Mallow, SPT (student physical therapist) Office phone: [phone]

## 2014-11-19 NOTE — Progress Notes (Signed)
Pt state he wanted to sleep and not to be disturbed.

## 2014-11-20 MED ORDER — METHOCARBAMOL 500 MG PO TABS: 500.0000 mg | ORAL_TABLET | Freq: Four times a day (QID) | ORAL | Status: DC | PRN

## 2014-11-20 MED ORDER — ASPIRIN 325 MG PO TBEC: 325.0000 mg | DELAYED_RELEASE_TABLET | Freq: Two times a day (BID) | ORAL | Status: DC

## 2014-11-20 MED ORDER — HYDROCODONE-ACETAMINOPHEN 5-325 MG PO TABS: 1.0000 | ORAL_TABLET | ORAL | Status: DC | PRN

## 2014-11-20 NOTE — Progress Notes (Signed)
Occupational Therapy Treatment Patient Details Name: Francisco Braun MRN: [id number] DOB: 1951/04/25 Today's Date: 11/20/2014    History of present illness pt presents with R THA.  Hx Hiatal Hernia repair.     OT comments  Patient progressing nicely towards goals, continue overall plan of care for now. However, patient will not need 24/7 supervision post discharge, am recommending intermittent supervision. Patient safe with mobility while using RW.    Follow Up Recommendations  No OT follow up;Supervision - Intermittent    Equipment Recommendations  3 in 1 bedside comode    Recommendations for Other Services  None at this time.    Precautions / Restrictions Precautions Precautions: None Precaution Comments: No precautions direct anterior approach Restrictions Weight Bearing Restrictions: Yes RLE Weight Bearing: Weight bearing as tolerated       Mobility Bed Mobility Overal bed mobility: Modified Independent Bed Mobility: Supine to Sit     Supine to sit: Modified independent (Device/Increase time) Sit to supine: Modified independent (Device/Increase time)    Transfers Overall transfer level: Modified independent Equipment used: Rolling walker (2 wheeled) Transfers: Sit to/from Stand Sit to Stand: Modified independent (Device/Increase time)         General transfer comment: Demonstrated proper hand placement for sit to stand    Balance Overall balance assessment: No apparent balance deficits (not formally assessed);Modified Independent    ADL General ADL Comments: Patient found in BR using RW for mobility, overall mod I. Patient dressed and stated he completed this on his own, without any assistance for pants or socks, "It took me awhile, but I did it". Patient ambulated > therapy gym for tub/shower transfer using BSC. Patient supervision for this transfer. Patient's wife present during session and independent to assist prn once discharged > home.       Cognition   Behavior During Therapy: WFL for tasks assessed/performed Overall Cognitive Status: Within Functional Limits for tasks assessed                 Pertinent Vitals/ Pain       Pain Assessment: 0-10 Pain Score: 8  Pain Location: right hip Pain Descriptors / Indicators: Sore Pain Intervention(s): Monitored during session;Repositioned         Frequency Min 2X/week     Progress Toward Goals  OT Goals(current goals can now be found in the care plan section)  Progress towards OT goals: Progressing toward goals  Acute Rehab OT Goals Patient Stated Goal: Return home  Plan Discharge plan needs to be updated (recommending intermittent supervision insteady of 24/7 and ONLY BSC for equipment )       End of Session Equipment Utilized During Treatment: Rolling walker   Activity Tolerance Patient tolerated treatment well   Patient Left in bed;with call bell/phone within reach;with family/visitor present     Time: 3825-0539 OT Time Calculation (min): 20 min  Charges: OT General Charges $OT Visit: 1 Procedure OT Treatments $Self Care/Home Management : 8-22 mins  Aprile Dickenson , MS, OTR/L, CLT Pager: [phone]  11/20/2014, 11:24 AM

## 2014-11-20 NOTE — Progress Notes (Signed)
Subjective: 2 Days Post-Op Procedure(s) (LRB): TOTAL HIP ARTHROPLASTY ANTERIOR APPROACH (Right)  Activity level:  wbat Diet tolerance:  ok Voiding:  *ok** Patient reports pain as 3 on 0-10 scale.    Objective: Vital signs in last 24 hours: Temp:  [98 F (36.7 C)-98.6 F (37 C)] 98 F (36.7 C) (03/17 0512) Pulse Rate:  [89-92] 89 (03/17 0512) Resp:  [16-18] 17 (03/17 0512) BP: (100-118)/(59-72) 115/69 mmHg (03/17 0512) SpO2:  [97 %-100 %] 98 % (03/17 0512)  Labs: No results for input(s): HGB in the last 72 hours. No results for input(s): WBC, RBC, HCT, PLT in the last 72 hours. No results for input(s): NA, K, CL, CO2, BUN, CREATININE, GLUCOSE, CALCIUM in the last 72 hours. No results for input(s): LABPT, INR in the last 72 hours.  Physical Exam:  Neurologically intact ABD soft Neurovascular intact Sensation intact distally Intact pulses distally Dorsiflexion/Plantar flexion intact  Assessment/Plan:  2 Days Post-Op Procedure(s) (LRB): TOTAL HIP ARTHROPLASTY ANTERIOR APPROACH (Right) Advance diet Up with therapy Discharge home with home health  ASA 325 BID x 4 weeks    Lorenz Donley R 11/20/2014, 11:29 AM

## 2014-11-20 NOTE — Progress Notes (Signed)
Physical Therapy Treatment Patient Details Name: Francisco Braun MRN: [id number] DOB: 11-May-1951 Today's Date: 11/20/2014    History of Present Illness pt presents with R THA.  Hx Hiatal Hernia repair.      PT Comments    Pt progressing well towards PT goals. Pt demonstrated safe navigation of stair set up necessary to get into home.  Current plan for d/c home with home health PT remains appropriate, per medical clearance for d/c.   Follow Up Recommendations  Home health PT;Supervision - Intermittent     Equipment Recommendations  None recommended by PT    Recommendations for Other Services       Precautions / Restrictions Precautions Precautions: None Precaution Comments: No precautions direct anterior approach Restrictions Weight Bearing Restrictions: Yes RLE Weight Bearing: Weight bearing as tolerated    Mobility  Bed Mobility Overal bed mobility: Modified Independent Bed Mobility: Supine to Sit     Supine to sit: Modified independent (Device/Increase time) Sit to supine: Modified independent (Device/Increase time)   General bed mobility comments: Able to navigate RLE from EOB to supine with no assist  Transfers Overall transfer level: Modified independent Equipment used: Rolling walker (2 wheeled) Transfers: Sit to/from Stand Sit to Stand: Modified independent (Device/Increase time)         General transfer comment: Demonstrated proper hand placement for sit to stand  Ambulation/Gait Ambulation/Gait assistance: Supervision Ambulation Distance (Feet): 200 Feet Assistive device: Rolling walker (2 wheeled) Gait Pattern/deviations: Antalgic;Decreased weight shift to right;Step-through pattern Gait velocity: Slow   General Gait Details: Guarded gait pattern due to pain, heavy reliance on walker for B UE support   Stairs            Wheelchair Mobility    Modified Rankin (Stroke Patients Only)       Balance Overall balance assessment: No  apparent balance deficits (not formally assessed);Modified Independent         Standing balance support: Bilateral upper extremity supported;During functional activity Standing balance-Leahy Scale: Good Standing balance comment: Able to tolerate standing without UE support for short duration                    Cognition Arousal/Alertness: Awake/alert Behavior During Therapy: WFL for tasks assessed/performed Overall Cognitive Status: Within Functional Limits for tasks assessed                      Exercises      General Comments General comments (skin integrity, edema, etc.): Able to tolerate ambulation without extreme pain in R hip, demonstrated stair navigation similar to home set up independently      Pertinent Vitals/Pain Pain Assessment: 0-10 Pain Score: 8  Pain Location: right hip Pain Descriptors / Indicators: Sore Pain Intervention(s): Monitored during session;Repositioned    Home Living                      Prior Function            PT Goals (current goals can now be found in the care plan section) Acute Rehab PT Goals Patient Stated Goal: Return home Progress towards PT goals: Progressing toward goals    Frequency  7X/week    PT Plan Current plan remains appropriate    Co-evaluation             End of Session Equipment Utilized During Treatment: Gait belt Activity Tolerance: Patient tolerated treatment well Patient left: in bed;with call bell/phone within reach  Time: 0932-6712 PT Time Calculation (min) (ACUTE ONLY): 14 min  Charges:  $Gait Training: 8-22 mins                    G Codes:      Smita Lesh 11/29/2014, 11:22 AM  Lucas Mallow, SPT (student physical therapist) Office phone: [phone]

## 2014-11-20 NOTE — Discharge Summary (Signed)
Patient ID: Francisco Braun MRN: [id number] DOB/AGE: [birth year] 64 y.o.  Admit date: 11/18/2014 Discharge date: 11/20/2014  Admission Diagnoses:  Active Problems:   S/P hip replacement   Primary osteoarthritis of right hip   Discharge Diagnoses:  Same  Past Medical History  Diagnosis Date  . Kidney stones   . Shingles 2/15    right side of the face  . History of hiatal hernia     had surgery    Surgeries: Procedure(s): TOTAL HIP ARTHROPLASTY ANTERIOR APPROACH on 11/18/2014   Consultants:    Discharged Condition: Improved  Hospital Course: [name] is an 64 y.o. male who was admitted 11/18/2014 for operative treatment of<principal problem not specified>. Patient has severe unremitting pain that affects sleep, daily activities, and work/hobbies. After pre-op clearance the patient was taken to the operating room on 11/18/2014 and underwent  Procedure(s): TOTAL HIP ARTHROPLASTY ANTERIOR APPROACH.    Patient was given perioperative antibiotics: Anti-infectives    Start     Dose/Rate Route Frequency Ordered Stop   11/18/14 1645  ceFAZolin (ANCEF) IVPB 2 g/50 mL premix     2 g 100 mL/hr over 30 Minutes Intravenous  Once 11/18/14 1243 11/18/14 1729   11/18/14 0600  ceFAZolin (ANCEF) IVPB 2 g/50 mL premix     2 g 100 mL/hr over 30 Minutes Intravenous On call to O.R. 11/17/14 1248 11/18/14 0800       Patient was given sequential compression devices, early ambulation, and chemoprophylaxis to prevent DVT.  Patient benefited maximally from hospital stay and there were no complications.    Recent vital signs: Patient Vitals for the past 24 hrs:  BP Temp Temp src Pulse Resp SpO2  11/20/14 0512 115/69 mmHg 98 F (36.7 C) Oral 89 17 98 %  11/20/14 0400 - - - - 16 99 %  11/19/14 2306 - - - - 18 100 %  11/19/14 2015 118/72 mmHg 98.6 F (37 C) Oral 90 17 99 %  11/19/14 2000 - - - - 16 98 %  11/19/14 1444 (!) 100/59 mmHg 98.1 F (36.7 C) Oral 92 18 97 %     Recent  laboratory studies: No results for input(s): WBC, HGB, HCT, PLT, NA, K, CL, CO2, BUN, CREATININE, GLUCOSE, INR, CALCIUM in the last 72 hours.  Invalid input(s): PT, 2   Discharge Medications:     Medication List    STOP taking these medications        diphenhydrAMINE 2 % cream  Commonly known as:  BENADRYL     naproxen sodium 220 MG tablet  Commonly known as:  ANAPROX      TAKE these medications        aspirin 325 MG EC tablet  Take 1 tablet (325 mg total) by mouth 2 (two) times daily after a meal.     HYDROcodone-acetaminophen 5-325 MG per tablet  Commonly known as:  NORCO/VICODIN  Take 1-2 tablets by mouth every 4 (four) hours as needed (breakthrough pain).     methocarbamol 500 MG tablet  Commonly known as:  ROBAXIN  Take 1 tablet (500 mg total) by mouth every 6 (six) hours as needed for muscle spasms.     OVER THE COUNTER MEDICATION  Take 1 Dose by mouth daily. OTC prostate medication, name unknown     traMADol 50 MG tablet  Commonly known as:  ULTRAM  Take 50 mg by mouth every 12 (twelve) hours as needed for moderate pain.  Diagnostic Studies: Dg Chest 2 View  11/06/2014   CLINICAL DATA:  Right hip replacement.  EXAM: CHEST  2 VIEW  COMPARISON:  None.  FINDINGS: Mediastinum hilar structures are normal. Lungs are clear of acute infiltrate. Postsurgical changes right lung. Pleural-parenchymal thickening noted in both apices and left base, most likely scarring. Small left pleural effusion cannot be excluded. Heart size normal. Degenerative changes thoracic spine. Surgical clips upper abdomen.  IMPRESSION: 1. Postsurgical changes right lung. Bilateral pleural-parenchymal scarring. Tiny left pleural effusion cannot be entirely excluded. Findings are most likely related to scarring. 2. No acute pulmonary disease.   Electronically Signed   By: Francisco Braun  Register   On: 11/06/2014 09:17   Dg Hip Operative Unilat With Pelvis Right  11/18/2014   CLINICAL DATA:  Right total  hip replacement  EXAM: OPERATIVE RIGHT HIP 2 VIEWS  TECHNIQUE: Fluoroscopic spot image(s) were submitted for interpretation post-operatively.  FLUOROSCOPY TIME:  0 MINUTES 23 SECOND  COMPARISON:  None.  FINDINGS: There is a total hip replacement on the right with prosthetic components appearing well-seated. No fracture or dislocation apparent.  IMPRESSION: Prosthetic components appear well seated.   Electronically Signed   By: Francisco Braun M.D.   On: 11/18/2014 09:29    Disposition: Final discharge disposition not confirmed      Discharge Instructions    Call MD / Call 911    Complete by:  As directed   If you experience chest pain or shortness of breath, CALL 911 and be transported to the hospital emergency room.  If you develope a fever above 101 F, pus (white drainage) or increased drainage or redness at the wound, or calf pain, call your surgeon's office.     Constipation Prevention    Complete by:  As directed   Drink plenty of fluids.  Prune juice may be helpful.  You may use a stool softener, such as Colace (over the counter) 100 mg twice a day.  Use MiraLax (over the counter) for constipation as needed.     Diet - low sodium heart healthy    Complete by:  As directed      Increase activity slowly as tolerated    Complete by:  As directed            Follow-up Information    Follow up In 2 weeks.      Follow up with [facility].   Specialty:  Emergency Medicine   Contact information:   [address] [id number] [place] [phone]      Follow up with Hessie Dibble, MD In 2 weeks.   Specialty:  Orthopedic Surgery   Contact information:   [place] [address] [phone]       Follow up with Hessie Dibble, MD In 2 weeks.   Specialty:  Orthopedic Surgery   Contact information:   [place] [address] [phone]        Signed: Dione Housekeeper 11/20/2014, 11:34 AM

## 2014-11-20 NOTE — Progress Notes (Signed)
D/C instructions complete and given to patient along with Rx. Taken by w/c to [facility] entrance by [employer] to meet mother/ ride to d/c home.

## 2014-11-20 NOTE — Progress Notes (Signed)
Physical Therapy Treatment Note  Clinical Impression:  Pt progressing well towards PT goals.  Pt was able to navigate two stairs with bilateral hand rails, simulating home environment.  Current plan remains appropriate for return home with HHPT, upon medical clearance and d/c.    11/20/14 1344  PT Visit Information  Last PT Received On 11/19/14  Assistance Needed +1  History of Present Illness pt presents with R THA.  Hx Hiatal Hernia repair.    PT Time Calculation  PT Start Time (ACUTE ONLY) 1344  PT Stop Time (ACUTE ONLY) 1355  PT Time Calculation (min) (ACUTE ONLY) 11 min  Subjective Data  Subjective Ready to go home  Patient Stated Goal Return home  Precautions  Precautions None  Precaution Comments No precautions direct anterior approach  Restrictions  Weight Bearing Restrictions Yes  RLE Weight Bearing WBAT  Pain Assessment  Pain Assessment 0-10  Pain Score 8  Pain Location R hip  Pain Descriptors / Indicators Sharp;Sore  Pain Intervention(s) Monitored during session  Cognition  Arousal/Alertness Awake/alert  Behavior During Therapy WFL for tasks assessed/performed  Overall Cognitive Status Within Functional Limits for tasks assessed  Transfers  Overall transfer level Modified independent  Equipment used Rolling walker (2 wheeled)  Transfers Sit to/from Stand  Sit to Stand Modified independent (Device/Increase time)  General transfer comment Demonstrated proper transfer technique  Ambulation/Gait  Ambulation/Gait assistance Supervision  Ambulation Distance (Feet) 200 Feet  Assistive device Rolling walker (2 wheeled)  Gait Pattern/deviations Antalgic;Step-through pattern;Decreased weight shift to right  Gait velocity slow  General Gait Details Guarded due to R LE pain, heavy reliance on walker for B UE support  Stairs Yes  Stairs assistance Modified independent (Device/Increase time)  Stair Management Two rails  Number of Stairs 2  General stair comments Able to  simulate home stair environment safely and without v/c  Balance  Overall balance assessment No apparent balance deficits (not formally assessed)  PT - End of Session  Activity Tolerance Patient tolerated treatment well  Patient left in bed;with call bell/phone within reach;with family/visitor present  Nurse Communication Mobility status  PT - Assessment/Plan  PT Plan Current plan remains appropriate  PT Frequency (ACUTE ONLY) 7X/week  Follow Up Recommendations Home health PT;Supervision - Intermittent  PT equipment None recommended by PT  PT Goal Progression  Progress towards PT goals Progressing toward goals   Lucas Mallow, SPT (student physical therapist) Office phone: [phone]

## 2014-11-25 ENCOUNTER — Encounter (HOSPITAL_COMMUNITY): Payer: Self-pay | Admitting: Orthopaedic Surgery

## 2015-02-05 ENCOUNTER — Telehealth: Payer: Self-pay

## 2015-02-05 MED ORDER — VALACYCLOVIR HCL 1 G PO TABS: 1000.0000 mg | ORAL_TABLET | Freq: Three times a day (TID) | ORAL | Status: DC

## 2015-02-05 NOTE — Telephone Encounter (Signed)
His shingles is coming back on his face, it is burning. 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 He wants to know if you can call something in for it.

## 2015-02-05 NOTE — Telephone Encounter (Signed)
Yes, Rx sent 

## 2015-04-01 ENCOUNTER — Telehealth: Payer: Self-pay

## 2015-04-01 ENCOUNTER — Telehealth: Payer: Self-pay | Admitting: Family Medicine

## 2015-04-01 NOTE — Telephone Encounter (Signed)
PT CAME IN AND SAID THAT HE'D BEEN EATING PEANUTS FOR A COUPLE DAYS AND HAS BEEN HAVING SOME SWELLING OF THE THROAT AND TONGUE. HE SAID HE'D BEEN TAKING BENADRYL AND HE JUST WANTED DR [name] TO CHECK IT OUT. AFTER I TOLD HIM WE DIDN'T HAVE ANY APPTS FOR TODAY AND TOLD HIM HE SHOUD GO TO URGENT CARE. HE SAID HE WASN'T GOING TO THE URGENT CARE [facility] [address] AND INSISTED ON GOING TO [place]. ONCE THE PT TOLD ME THIS I WENT TO GET DR [name] AND AFTERWARDS THE PT AGREED TO GO TO URGENT CARE AND FOLLOW UP WITH [place] TOMORROW. HE CAME IN AFTER LEAVING URGENT CARE AND TOLD ME THAT HIS BENADRYL WAS WEARING OFF AND HIS THROAT HAD STARTED TO SWELL BUT THEY GAVE HIM A SHOT.

## 2015-04-01 NOTE — Telephone Encounter (Signed)
Patient came in the office and told our check in lady that he thought he might be having an allergic reaction to nuts. He had ate some and felt like he may have some swelling in his neck and tongue. We have no appointments open and have 2 providers gone. We checked with Dr.[name] and she stated that he needed to go across the street to the urgent care for further eval. Dr. Sanda Klein also informed patient that he needed to go to the urgent care across the street ASAP since we have no openings avaliable for today.

## 2015-04-02 ENCOUNTER — Encounter: Payer: Self-pay | Admitting: Family Medicine

## 2015-04-02 ENCOUNTER — Ambulatory Visit (INDEPENDENT_AMBULATORY_CARE_PROVIDER_SITE_OTHER): Payer: BLUE CROSS/BLUE SHIELD | Admitting: Family Medicine

## 2015-04-02 ENCOUNTER — Other Ambulatory Visit (INDEPENDENT_AMBULATORY_CARE_PROVIDER_SITE_OTHER): Payer: BLUE CROSS/BLUE SHIELD

## 2015-04-02 VITALS — BP 148/88 | HR 110 | Temp 98.9°F | Wt 168.0 lb

## 2015-04-02 DIAGNOSIS — B0229 Other postherpetic nervous system involvement: Secondary | ICD-10-CM | POA: Insufficient documentation

## 2015-04-02 DIAGNOSIS — R030 Elevated blood-pressure reading, without diagnosis of hypertension: Secondary | ICD-10-CM

## 2015-04-02 DIAGNOSIS — T7819XA Other adverse food reactions, not elsewhere classified, initial encounter: Secondary | ICD-10-CM

## 2015-04-02 DIAGNOSIS — IMO0001 Reserved for inherently not codable concepts without codable children: Secondary | ICD-10-CM

## 2015-04-02 DIAGNOSIS — T781XXA Other adverse food reactions, not elsewhere classified, initial encounter: Secondary | ICD-10-CM

## 2015-04-02 MED ORDER — EPINEPHRINE 0.3 MG/0.3ML IJ SOAJ: 0.3000 mg | Freq: Once | INTRAMUSCULAR | Status: DC

## 2015-04-02 MED ORDER — DIPHENHYDRAMINE HCL 25 MG PO CAPS: 25.0000 mg | ORAL_CAPSULE | Freq: Four times a day (QID) | ORAL | Status: DC | PRN

## 2015-04-02 MED ORDER — MONTELUKAST SODIUM 10 MG PO TABS: 10.0000 mg | ORAL_TABLET | Freq: Every day | ORAL | Status: DC

## 2015-04-02 MED ORDER — DIPHENHYDRAMINE HCL 25 MG PO CAPS
50.0000 mg | ORAL_CAPSULE | Freq: Four times a day (QID) | ORAL | Status: DC | PRN
  Administered 2015-04-02: 50 mg via ORAL

## 2015-04-02 NOTE — Patient Instructions (Addendum)
Avoid tomatoes Avoid peanuts Avoid Sprite As a precaution, be aware of these ingredients in Sprite to avoid: carbonated water, high fructose corn syrup, citric acid, natural flavors, sodium citrate, sodium benzoate We're going to have you see an allergist Continue the prednisone and benadryl and zyrtec You have been given 50 mg of benadryl here in the office I am adding singulair Stay on the benadryl (diphenhydramine) around-the-clock per package directions If you develop problems or throat swelling, call 911 and use the epi-pen if life-threatening reaction I would recommend talking to Dr. Gloriann Loan about any kind of medicine or nerve block for the shingles discomfort  Try the DASH guidelines for your blood pressure (white coat hypertension) Monitor your blood pressure and contact me if your top number doesn't stay below 140  DASH Eating Plan DASH stands for "Dietary Approaches to Stop Hypertension." The DASH eating plan is a healthy eating plan that has been shown to reduce high blood pressure (hypertension). Additional health benefits may include reducing the risk of type 2 diabetes mellitus, heart disease, and stroke. The DASH eating plan may also help with weight loss. WHAT DO I NEED TO KNOW ABOUT THE DASH EATING PLAN? For the DASH eating plan, you will follow these general guidelines:  Choose foods with a percent daily value for sodium of less than 5% (as listed on the food label).  Use salt-free seasonings or herbs instead of table salt or sea salt.  Check with your health care provider or pharmacist before using salt substitutes.  Eat lower-sodium products, often labeled as "lower sodium" or "no salt added."  Eat fresh foods.  Eat more vegetables, fruits, and low-fat dairy products.  Choose whole grains. Look for the word "whole" as the first word in the ingredient list.  Choose fish and skinless chicken or [place] more often than red meat. Limit fish, poultry, and meat to 6 oz  (170 g) each day.  Limit sweets, desserts, sugars, and sugary drinks.  Choose heart-healthy fats.  Limit cheese to 1 oz (28 g) per day.  Eat more home-cooked food and less restaurant, buffet, and fast food.  Limit fried foods.  Cook foods using methods other than frying.  Limit canned vegetables. If you do use them, rinse them well to decrease the sodium.  When eating at a restaurant, ask that your food be prepared with less salt, or no salt if possible. WHAT FOODS CAN I EAT? Seek help from a dietitian for individual calorie needs. Grains Whole grain or whole wheat bread. Brown rice. Whole grain or whole wheat pasta. Quinoa, bulgur, and whole grain cereals. Low-sodium cereals. Corn or whole wheat flour tortillas. Whole grain cornbread. Whole grain crackers. Low-sodium crackers. Vegetables Fresh or frozen vegetables (raw, steamed, roasted, or grilled). Low-sodium or reduced-sodium tomato and vegetable juices. Low-sodium or reduced-sodium tomato sauce and paste. Low-sodium or reduced-sodium canned vegetables.  Fruits All fresh, canned (in natural juice), or frozen fruits. Meat and Other Protein Products Ground beef (85% or leaner), grass-fed beef, or beef trimmed of fat. Skinless chicken or [place]. Ground chicken or [place]. Pork trimmed of fat. All fish and seafood. Eggs. Dried beans, peas, or lentils. Unsalted nuts and seeds. Unsalted canned beans. Dairy Low-fat dairy products, such as skim or 1% milk, 2% or reduced-fat cheeses, low-fat ricotta or cottage cheese, or plain low-fat yogurt. Low-sodium or reduced-sodium cheeses. Fats and Oils Tub margarines without trans fats. Light or reduced-fat mayonnaise and salad dressings (reduced sodium). Avocado. Safflower, olive, or canola oils. Natural peanut  or almond butter. Other Unsalted popcorn and pretzels. The items listed above may not be a complete list of recommended foods or beverages. Contact your dietitian for more options. WHAT  FOODS ARE NOT RECOMMENDED? Grains White bread. White pasta. White rice. Refined cornbread. Bagels and croissants. Crackers that contain trans fat. Vegetables Creamed or fried vegetables. Vegetables in a cheese sauce. Regular canned vegetables. Regular canned tomato sauce and paste. Regular tomato and vegetable juices. Fruits Dried fruits. Canned fruit in light or heavy syrup. Fruit juice. Meat and Other Protein Products Fatty cuts of meat. Ribs, chicken wings, bacon, sausage, bologna, salami, chitterlings, fatback, hot dogs, bratwurst, and packaged luncheon meats. Salted nuts and seeds. Canned beans with salt. Dairy Whole or 2% milk, cream, half-and-half, and cream cheese. Whole-fat or sweetened yogurt. Full-fat cheeses or blue cheese. Nondairy creamers and whipped toppings. Processed cheese, cheese spreads, or cheese curds. Condiments Onion and garlic salt, seasoned salt, table salt, and sea salt. Canned and packaged gravies. Worcestershire sauce. Tartar sauce. Barbecue sauce. Teriyaki sauce. Soy sauce, including reduced sodium. Steak sauce. Fish sauce. Oyster sauce. Cocktail sauce. Horseradish. Ketchup and mustard. Meat flavorings and tenderizers. Bouillon cubes. Hot sauce. Tabasco sauce. Marinades. Taco seasonings. Relishes. Fats and Oils Butter, stick margarine, lard, shortening, ghee, and bacon fat. Coconut, palm kernel, or palm oils. Regular salad dressings. Other Pickles and olives. Salted popcorn and pretzels. The items listed above may not be a complete list of foods and beverages to avoid. Contact your dietitian for more information. WHERE CAN I FIND MORE INFORMATION? National Heart, Lung, and Blood Institute: travelstabloid.com Document Released: 08/11/2011 Document Revised: 01/06/2014 Document Reviewed: 06/26/2013 [facility] Patient Information 2015 [place], [place]. This information is not intended to replace advice given to you by your health care  provider. Make sure you discuss any questions you have with your health care provider. Epinephrine Injection Epinephrine is a medicine given by injection to temporarily treat an emergency allergic reaction. It is also used to treat severe asthmatic attacks and other lung problems. The medicine helps to enlarge (dilate) the small breathing tubes of the lungs. A life-threatening, sudden allergic reaction that involves the whole body is called anaphylaxis. Because of potential side effects, epinephrine should only be used as directed by your caregiver. RISKS AND COMPLICATIONS Possible side effects of epinephrine injections include:  Chest pain.  Irregular or rapid heartbeat.  Shortness of breath.  Nausea.  Vomiting.  Abdominal pain or cramping.  Sweating.  Dizziness.  Weakness.  Headache.  Nervousness. Report all side effects to your caregiver. HOW TO GIVE AN EPINEPHRINE INJECTION Give the epinephrine injection immediately when symptoms of a severe reaction begin. Inject the medicine into the outer thigh or any available, large muscle. Your caregiver can teach you how to do this. You do not need to remove any clothing. After the injection, call your local emergency services (911 in [place] Even if you improve after the injection, you need to be examined at a hospital emergency department. Epinephrine works quickly, but it also wears off quickly. Delayed reactions can occur. A delayed reaction may be as serious and dangerous as the initial reaction. HOME CARE INSTRUCTIONS  Make sure you and your family know how to give an epinephrine injection.  Use epinephrine injections as directed by your caregiver. Do not use this medicine more often or in larger doses than prescribed.  Always carry your epinephrine injection or anaphylaxis kit with you. This can be lifesaving if you have a severe reaction.  Store the medicine in  a cool, dry place. If the medicine becomes discolored or cloudy,  dispose of it properly and replace it with new medicine.  Check the expiration date on your medicine. It may be unsafe to use medicines past their expiration date.  Tell your caregiver about any other medicines you are taking. Some medicines can react badly with epinephrine.  Tell your caregiver about any medical conditions you have, such as diabetes, high blood pressure (hypertension), heart disease, irregular heartbeats, or if you are pregnant. SEEK IMMEDIATE MEDICAL CARE IF:  You have used an epinephrine injection. Call your local emergency services (911 in [place] Even if you improve after the injection, you need to be examined at a hospital emergency department to make sure your allergic reaction is under control. You will also be monitored for adverse effects from the medicine.  You have chest pain.  You have irregular or fast heartbeats.  You have shortness of breath.  You have severe headaches.  You have severe nausea, vomiting, or abdominal cramps.  You have severe pain, swelling, or redness in the area where you gave the injection. Document Released: 08/19/2000 Document Revised: 11/14/2011 Document Reviewed: 05/11/2011 [facility] Patient Information 2015 [place], [place]. This information is not intended to replace advice given to you by your health care provider. Make sure you discuss any questions you have with your health care provider.

## 2015-04-02 NOTE — Progress Notes (Signed)
BP 148/88 mmHg  Pulse 110  Temp(Src) 98.9 F (37.2 C)  Wt 168 lb (76.204 kg)  SpO2 98%   Subjective:    Patient ID: Francisco Braun, male    DOB: 05-12-1951, 64 y.o.   MRN: [id number]  HPI: Francisco Braun is a 64 y.o. male  Chief Complaint  Patient presents with  . Allergic Reaction    Possibly to peanuts or sprite. He was seen at urgent care across the street yesterday. They gave him a shot of something. He wasn't sure what it was. He's now noticed that he gets to feeling funny after drinking Sprite.   He had been been eating peanuts, 4 packs a day all last week Saturday and Sunday, ate one pound of raw peanuts, Monday and Tuesday one pound of raw peanuts He has had two pounds of big tomatoes each day for a week He drank a Sprite 40 minutes ago and he started to get swelling in his lips and throat He was so tight in his throat and tongue swelled up;  He saw Dr. Rudie Meyer and was put on prednisone 6 day taper; also taking benadryl and zyrtec; he told him he didn't have a stroke He opened a Sprite last night and then he was on the phone for two hours and then speech went; pushed in his throat and could pronounce The person he was on the phone with was worried; he is able to speak now just fine, but as soon as he took some Sprite at lunch time, he couldn't get the S's and T's out right; speech is okay now after taking Benadryl  His father died from a stroke; patient wants to be checked out completely Dr. Dewaine Oats told him no more iced tea; no soft drinks and no iced tea after hiatal hernia surgery and the re-do but patient has been slipping He was on nine raw eggs daily after the surgery He lost weight after surgery and gained strength back with that regimen  He is having itching over the right eyelid; using lidocaine over the eyebrow; has seen Dr. Dawna Part; vision was affected; right eyelid has always been lazy Dr. Dawna Part did MRI to r/o tumor; saw Dr. Gloriann Loan; mole on the back of  the eye; wearing sunglasses as advised  Relevant past medical, surgical, family and social history reviewed and updated as indicated. Interim medical history since our last visit reviewed. Allergies and medications reviewed and updated.  Review of Systems  Per HPI unless specifically indicated above     Objective:    BP 148/88 mmHg  Pulse 110  Temp(Src) 98.9 F (37.2 C)  Wt 168 lb (76.204 kg)  SpO2 98%  Wt Readings from Last 3 Encounters:  04/02/15 168 lb (76.204 kg)  07/25/14 171 lb (77.565 kg)  11/18/14 169 lb (76.658 kg)    Physical Exam  Constitutional: He appears well-developed and well-nourished. No distress.  HENT:  Head: Normocephalic and atraumatic.  Right Ear: Hearing, tympanic membrane, external ear and ear canal normal. No middle ear effusion.  Left Ear: Hearing, tympanic membrane, external ear and ear canal normal.  No middle ear effusion.  Nose: No rhinorrhea.  Mouth/Throat: Mucous membranes are dry (tacky).  Eyes: EOM are normal. Pupils are equal, round, and reactive to light. Right conjunctiva is not injected. Left conjunctiva is not injected. No scleral icterus.  Slight lag of the right eyelid (present from birth says patient)  Neck: No thyromegaly present.  Cardiovascular: Normal rate and regular  rhythm.   Pulmonary/Chest: Effort normal and breath sounds normal.  Abdominal: Soft. Bowel sounds are normal. He exhibits no distension.  Musculoskeletal: He exhibits no edema.  Neurological: He is alert. He has normal strength. He displays no atrophy and no tremor. No cranial nerve deficit. He exhibits normal muscle tone. He displays a negative Romberg sign. Coordination and gait normal.  Right upper lid as noted, slight lag, present from birth  Skin: Skin is warm and dry. No rash noted. Rash is not urticarial. No pallor.  Psychiatric: He has a normal mood and affect. His behavior is normal. Judgment and thought content normal.      Assessment & Plan:    Problem List Items Addressed This Visit      Nervous and Auditory   Post herpetic neuralgia    Patient was advised to talk to Dr. 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  An After Visit Summary was printed and given to the patient.  Meds ordered this encounter  Medications  . predniSONE (STERAPRED UNI-PAK 21 TAB) 10 MG (21) TBPK tablet    Sig:   . aspirin EC 81 MG tablet    Sig: Take 81 mg by mouth 2 (two) times daily.  . cetirizine (ZYRTEC) 10 MG tablet    Sig: Take 10 mg by mouth daily.  . diphenhydrAMINE (SOMINEX) 25 MG tablet    Sig: Take 25 mg by mouth 2 (two) times daily.  . montelukast (SINGULAIR) 10 MG tablet    Sig: Take 1 tablet (10 mg total) by mouth at bedtime.    Dispense:  30 tablet    Refill:  11  . EPINEPHrine 0.3 mg/0.3 mL IJ SOAJ injection    Sig: Inject 0.3 mLs (0.3 mg total) into the muscle once. For use in life-threatening emergency    Dispense:  1 Device    Refill:  3

## 2015-04-02 NOTE — Assessment & Plan Note (Signed)
rfer to allergist, epi-pen if anaphylaxis; continue meds plus singulair

## 2015-04-02 NOTE — Assessment & Plan Note (Signed)
Patient was advised to talk to Dr. Gloriann Loan

## 2015-04-02 NOTE — Telephone Encounter (Signed)
Staff told me there was a patient at the window, symptoms of throat and tongue swelling; he was directed to go to urgent care across the street; he did not want to do that and wanted to drive to [facility]; I personally went out to his car and explained the urgency, to go across the street (office door less than 50 yards in front of [place] in plain site); he might not make it to [place]; he agreed to go across the street for urgent evaluation and treatment

## 2015-04-20 ENCOUNTER — Other Ambulatory Visit: Payer: Self-pay | Admitting: Unknown Physician Specialty

## 2015-04-20 DIAGNOSIS — R4701 Aphasia: Secondary | ICD-10-CM

## 2015-04-20 DIAGNOSIS — R070 Pain in throat: Secondary | ICD-10-CM

## 2015-04-20 DIAGNOSIS — R1310 Dysphagia, unspecified: Secondary | ICD-10-CM

## 2015-04-21 ENCOUNTER — Other Ambulatory Visit: Payer: Self-pay | Admitting: Unknown Physician Specialty

## 2015-04-21 DIAGNOSIS — R1310 Dysphagia, unspecified: Secondary | ICD-10-CM

## 2015-04-21 DIAGNOSIS — R4701 Aphasia: Secondary | ICD-10-CM

## 2015-04-21 DIAGNOSIS — R2990 Unspecified symptoms and signs involving the nervous system: Secondary | ICD-10-CM

## 2015-04-21 DIAGNOSIS — R070 Pain in throat: Secondary | ICD-10-CM

## 2015-04-22 ENCOUNTER — Ambulatory Visit
Admission: RE | Admit: 2015-04-22 | Discharge: 2015-04-22 | Disposition: A | Discharge status: Home / Self Care | Payer: BLUE CROSS/BLUE SHIELD | Source: Ambulatory Visit | Attending: Unknown Physician Specialty | Admitting: Unknown Physician Specialty

## 2015-04-22 DIAGNOSIS — R070 Pain in throat: Secondary | ICD-10-CM | POA: Diagnosis present

## 2015-04-22 DIAGNOSIS — R2990 Unspecified symptoms and signs involving the nervous system: Secondary | ICD-10-CM

## 2015-04-22 DIAGNOSIS — R1310 Dysphagia, unspecified: Secondary | ICD-10-CM

## 2015-04-22 DIAGNOSIS — R4701 Aphasia: Secondary | ICD-10-CM

## 2015-04-24 ENCOUNTER — Other Ambulatory Visit: Payer: Self-pay | Admitting: Unknown Physician Specialty

## 2015-04-24 DIAGNOSIS — R5382 Chronic fatigue, unspecified: Secondary | ICD-10-CM

## 2015-04-24 DIAGNOSIS — R531 Weakness: Secondary | ICD-10-CM

## 2015-04-24 DIAGNOSIS — R4781 Slurred speech: Secondary | ICD-10-CM

## 2015-04-29 ENCOUNTER — Ambulatory Visit
Admission: RE | Admit: 2015-04-29 | Discharge: 2015-04-29 | Disposition: A | Discharge status: Home / Self Care | Payer: BLUE CROSS/BLUE SHIELD | Source: Ambulatory Visit | Attending: Unknown Physician Specialty | Admitting: Unknown Physician Specialty

## 2015-04-29 DIAGNOSIS — R51 Headache: Secondary | ICD-10-CM | POA: Diagnosis present

## 2015-04-29 DIAGNOSIS — R531 Weakness: Secondary | ICD-10-CM | POA: Insufficient documentation

## 2015-04-29 DIAGNOSIS — R5382 Chronic fatigue, unspecified: Secondary | ICD-10-CM

## 2015-04-29 DIAGNOSIS — R4781 Slurred speech: Secondary | ICD-10-CM | POA: Insufficient documentation

## 2015-04-29 MED ORDER — GADOBENATE DIMEGLUMINE 529 MG/ML IV SOLN
20.0000 mL | Freq: Once | INTRAVENOUS | Status: AC | PRN
  Administered 2015-04-29: 15 mL via INTRAVENOUS

## 2015-04-30 ENCOUNTER — Telehealth: Payer: Self-pay | Admitting: Neurology

## 2015-04-30 ENCOUNTER — Encounter: Payer: Self-pay | Admitting: Neurology

## 2015-04-30 ENCOUNTER — Ambulatory Visit (INDEPENDENT_AMBULATORY_CARE_PROVIDER_SITE_OTHER): Payer: BLUE CROSS/BLUE SHIELD | Admitting: Neurology

## 2015-04-30 VITALS — BP 134/85 | HR 83 | Ht 72.0 in | Wt 156.0 lb

## 2015-04-30 DIAGNOSIS — R531 Weakness: Secondary | ICD-10-CM

## 2015-04-30 DIAGNOSIS — Z87828 Personal history of other (healed) physical injury and trauma: Secondary | ICD-10-CM

## 2015-04-30 MED ORDER — PYRIDOSTIGMINE BROMIDE 60 MG PO TABS: 60.0000 mg | ORAL_TABLET | Freq: Three times a day (TID) | ORAL | Status: DC
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
## 2015-05-01 ENCOUNTER — Encounter: Payer: Self-pay | Admitting: Neurology

## 2015-05-05 ENCOUNTER — Encounter: Payer: Self-pay | Admitting: *Deleted

## 2015-05-05 ENCOUNTER — Telehealth: Payer: Self-pay | Admitting: Neurology

## 2015-05-05 NOTE — Telephone Encounter (Signed)
Patient needs note for work by tomorrow 05/06/15

## 2015-05-05 NOTE — Telephone Encounter (Signed)
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  He is also still having some overall weakness/fatigue but feels the medication, rest and limiting his time in the heat have been helpful.

## 2015-05-05 NOTE — Telephone Encounter (Signed)
Spoke to patient - he is aware of results and will keep his follow up appt to discuss.

## 2015-05-05 NOTE — Telephone Encounter (Signed)
Patient called requesting note for employer to be out of work through 05/12/15

## 2015-05-05 NOTE — Telephone Encounter (Signed)
Please call patient, lab showed positive AchR antibody consistent with Diagnosis Myasthenia Gravis.  Check to see if his symptoms have improved with Mestinon.  Keep followup appt in Sept 6th for discussion of treatment.

## 2015-05-05 NOTE — Telephone Encounter (Signed)
Letter provided to patient

## 2015-05-06 NOTE — Telephone Encounter (Signed)
He has an appointment on 05/07/15.  I called [facility] (where his MRI is scheduled) to make sure his scan would be read in time.

## 2015-05-07 ENCOUNTER — Encounter: Payer: Self-pay | Admitting: Neurology

## 2015-05-07 ENCOUNTER — Ambulatory Visit (INDEPENDENT_AMBULATORY_CARE_PROVIDER_SITE_OTHER): Payer: BLUE CROSS/BLUE SHIELD | Admitting: Neurology

## 2015-05-07 ENCOUNTER — Ambulatory Visit
Admission: RE | Admit: 2015-05-07 | Discharge: 2015-05-07 | Disposition: A | Discharge status: Home / Self Care | Payer: BLUE CROSS/BLUE SHIELD | Source: Ambulatory Visit | Attending: Neurology | Admitting: Neurology

## 2015-05-07 ENCOUNTER — Telehealth: Payer: Self-pay | Admitting: Neurology

## 2015-05-07 ENCOUNTER — Encounter: Payer: Self-pay | Admitting: *Deleted

## 2015-05-07 VITALS — BP 123/77 | HR 80 | Ht 72.0 in | Wt 160.0 lb

## 2015-05-07 DIAGNOSIS — R531 Weakness: Secondary | ICD-10-CM | POA: Insufficient documentation

## 2015-05-07 DIAGNOSIS — G7000 Myasthenia gravis without (acute) exacerbation: Secondary | ICD-10-CM | POA: Insufficient documentation

## 2015-05-07 DIAGNOSIS — M4802 Spinal stenosis, cervical region: Secondary | ICD-10-CM | POA: Diagnosis not present

## 2015-05-07 DIAGNOSIS — M5031 Other cervical disc degeneration,  high cervical region: Secondary | ICD-10-CM | POA: Diagnosis not present

## 2015-05-07 DIAGNOSIS — Z87828 Personal history of other (healed) physical injury and trauma: Secondary | ICD-10-CM | POA: Diagnosis present

## 2015-05-07 MED ORDER — PYRIDOSTIGMINE BROMIDE 60 MG PO TABS: 60.0000 mg | ORAL_TABLET | Freq: Four times a day (QID) | ORAL | Status: DC

## 2015-05-07 MED ORDER — MYCOPHENOLATE MOFETIL 500 MG PO TABS
1000.0000 mg | ORAL_TABLET | Freq: Two times a day (BID) | ORAL | Status: DC
Start: 2015-05-07 — End: 2015-07-01

## 2015-05-07 NOTE — Telephone Encounter (Signed)
Reviewed MRI of cervical spine, Moderate multilevel cervical disc degeneration, worst at C3-4 where there is mild spinal stenosis and right greater than left neural foraminal stenosis.

## 2015-05-07 NOTE — Patient Instructions (Addendum)
[email or web address]    Medicine should avoid  . Telithromycin ([place]) - inpatient drug for serious pneumonia. Should not be used in patients with MG. FDA has designated a "black box" warning (see below for explanation) on this drug in MG patients. . The fluoroquinolones, including Ciprofloxacin and Levofloxacin - commonly prescribed antibiotics that are rarely associated with worsening MG. The [place] FDA has designated a "black box warning" on Ciprofloxacin/Avelox and Levofloxacin. Use cautiously, if at all. . Zithromax (e.g. "Z-pak") - commonly prescribed but potentially dangerous in MG. Use cautiously, if at all. . Gentamycin, neomycin (aminoglycoside antibiotics; tobramycin may be least offensive) - use cautiously if no alternative treatment available. o Other antibiotics have been rarely reported with worsening MG. Please discuss with physician. . Botulinum toxin (e.g. "Botox") - avoid. . Steroids (e.g. prednisone) - steroids are a common treatment for MG but patients who start steroids may have transient worsening of their MG during the first two weeks prior to an improvement in their MG. Thus, patients need to monitor carefully for this possibility.  2 . Quinine - sometimes used for leg cramps. . Procainamide - cardiac drug used for irregular heart rhythm. . Magnesium in patients with kidney disease; potentially dangerous if given intravenous, for example, for eclampsia treatment during late pregnancy. (Many multivitamins contain small amounts of magnesium, which is okay.) . D-penicillamine - drug rarely used these days but strongly associated with causing MG. . Beta-blockers - commonly prescribed for hypertension, heart disease and migraine but potentially dangerous in MG. Use cautiously, if necessary.    Myasthenia Gravis Myasthenia gravis is a disease that causes muscle weakness throughout the body. The muscles affected are the ones we can control (voluntary muscles). An  example of a voluntary muscle is your hand muscles. You can control the muscles to make the hand pick something up. An example of an involuntary muscle is the heart. The heart beats without any direction from you.  Myasthenia Gravis is thought to be an autoimmune disease. That means that normal defenses of the body begin to attack the body. In this case, the immune system begins to attack cells located at the junctions of the muscles and the nerves. Women are affected more often. Women are affected at a younger age than men. Babies born to affected women frequently develop symptoms at an early age. SYMPTOMS Initially in the disease, the facial muscles are affected first. After this, a person may develop droopy eyelids. They may have difficulty controlling facial muscles. They may have problems chewing. Swallowing and speaking may become impaired. The weakness gradually spreads to the arms and legs. It begins to affect breathing. Sometimes, the symptoms lessen or go away without any apparent cause. DIAGNOSIS  Diagnosis can be made with blood tests. Tests such as electromyography may be done to examine the electrical activity in the muscle. An improvement in symptoms after having an anti-cholinesterase drug helps confirm the diagnosis.  TREATMENT  Medicines are usually prescribed as the first treatment. These medicines help, but they do not cure the disease. A plasma cleansing procedure (plasmapheresis) can be used to treat a crisis. It can also be used to prepare a person for surgery. This procedure produces short-term improvement. Some cases are helped by removing the thymus gland. Steroids are used for short-term benefits. Document Released: 11/28/2000 Document Revised: 11/14/2011 Document Reviewed: 10/23/2013 [facility] Patient Information 2015 [place], [place]. This information is not intended to replace advice given to you by your health care provider. Make sure you discuss any questions  you have with your  health care provider.
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
## 2015-05-12 ENCOUNTER — Ambulatory Visit
Admission: RE | Admit: 2015-05-12 | Discharge: 2015-05-12 | Disposition: A | Discharge status: Home / Self Care | Payer: BLUE CROSS/BLUE SHIELD | Source: Ambulatory Visit | Attending: Neurology | Admitting: Neurology

## 2015-05-12 ENCOUNTER — Ambulatory Visit: Payer: Self-pay | Admitting: Neurology

## 2015-05-12 DIAGNOSIS — K449 Diaphragmatic hernia without obstruction or gangrene: Secondary | ICD-10-CM | POA: Diagnosis not present

## 2015-05-12 DIAGNOSIS — G7000 Myasthenia gravis without (acute) exacerbation: Secondary | ICD-10-CM | POA: Insufficient documentation

## 2015-05-13 ENCOUNTER — Telehealth: Payer: Self-pay | Admitting: *Deleted

## 2015-05-13 NOTE — Telephone Encounter (Signed)
Spoke to patient - aware of results. 

## 2015-05-13 NOTE — Telephone Encounter (Signed)
-----   Message from Marcial Pacas, MD sent at 05/13/2015  7:39 AM EDT ----- Please call patient, there is no evidence of thymus lesion. Evidence of right lower lung postoperative changes.

## 2015-05-14 LAB — ANA W/REFLEX IF POSITIVE: ANA: NEGATIVE

## 2015-05-14 LAB — THYROID PANEL WITH TSH
Free Thyroxine Index: 2.3 (ref 1.2–4.9)
T3 Uptake Ratio: 29 % (ref 24–39)
T4, Total: 7.9 ug/dL (ref 4.5–12.0)
TSH: 1.01 u[IU]/mL (ref 0.450–4.500)

## 2015-05-14 LAB — ACETYLCHOLINE RECEPTOR, BINDING: ACHR BINDING AB, SERUM: 19 nmol/L — AB (ref 0.00–0.24)

## 2015-05-14 LAB — C-REACTIVE PROTEIN: CRP: 0.6 mg/L (ref 0.0–4.9)

## 2015-05-14 LAB — ACETYLCHOLINE RECEPTOR, MODULATING: Acetylcholine Modulat Ab: 43 % — ABNORMAL HIGH (ref 0–20)

## 2015-05-14 LAB — SEDIMENTATION RATE: Sed Rate: 6 mm/hr (ref 0–30)

## 2015-05-14 LAB — CK: Total CK: 92 U/L (ref 24–204)

## 2015-05-20 ENCOUNTER — Encounter: Payer: Self-pay | Admitting: Neurology

## 2015-05-20 ENCOUNTER — Ambulatory Visit (INDEPENDENT_AMBULATORY_CARE_PROVIDER_SITE_OTHER): Payer: BLUE CROSS/BLUE SHIELD | Admitting: Neurology

## 2015-05-20 ENCOUNTER — Telehealth: Payer: Self-pay | Admitting: Neurology

## 2015-05-20 VITALS — BP 128/82 | HR 88 | Ht 72.0 in | Wt 158.0 lb

## 2015-05-20 DIAGNOSIS — G7000 Myasthenia gravis without (acute) exacerbation: Secondary | ICD-10-CM | POA: Diagnosis not present

## 2015-05-20 NOTE — Telephone Encounter (Signed)
Spoke to patient - he has noticed more difficulty speaking despite taking Mestinon as prescribed - feels his tongue is swollen but denies any breathing problems or swallowing problems.  He is coming home from a trip today and will come in this afternoon for an evaluation.  I instructed him to proceed to the ED if he starts having breathing or swallowing issues before he gets here.
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
## 2015-05-20 NOTE — Telephone Encounter (Signed)
Pt called says he is having a hard time speaking. Says tongue is thick. Nurse will call pt back . [phone]

## 2015-05-27 ENCOUNTER — Telehealth: Payer: Self-pay | Admitting: Neurology

## 2015-05-27 NOTE — Telephone Encounter (Signed)
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  Says he was fine yesterday but today he felt his chest was tight and he was easily winded.  Says he feels it is mildly more difficult in inhale but no changes when he exhales. He says his breathing is not too bad.  I told him if his breathing worsens then he needs to go to the emergency room.  I also told him we will be glad to work him into our schedule.  He said he is going to try to work tonight.  He is aware that we can provide him a note to out if he feels like he cannot make it.

## 2015-05-27 NOTE — Telephone Encounter (Signed)
Patient is calling because he needs a note to keep him out of work until 06-01-15. The patient states he is having trouble breathing today. Please call patient and advise and he will pick up the note. Thank you.

## 2015-05-28 ENCOUNTER — Telehealth: Payer: Self-pay | Admitting: Neurology

## 2015-05-28 ENCOUNTER — Encounter: Payer: Self-pay | Admitting: *Deleted

## 2015-05-28 ENCOUNTER — Ambulatory Visit: Payer: Self-pay | Admitting: Neurology

## 2015-05-28 ENCOUNTER — Ambulatory Visit (INDEPENDENT_AMBULATORY_CARE_PROVIDER_SITE_OTHER): Payer: BLUE CROSS/BLUE SHIELD | Admitting: Neurology

## 2015-05-28 ENCOUNTER — Encounter: Payer: Self-pay | Admitting: Neurology

## 2015-05-28 VITALS — BP 131/82 | HR 75 | Ht 72.0 in | Wt 158.0 lb

## 2015-05-28 DIAGNOSIS — G7000 Myasthenia gravis without (acute) exacerbation: Secondary | ICD-10-CM

## 2015-05-28 MED ORDER — PREDNISONE 10 MG PO TABS: ORAL_TABLET | ORAL | Status: DC

## 2015-05-28 NOTE — Telephone Encounter (Signed)
Patient wants a call back from yesterday conversation with Sharyn Lull. Best number is [phone]

## 2015-05-28 NOTE — Telephone Encounter (Signed)
His chest still feels tight and he is still easily winded.  He has been worked into the schedule today.
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
## 2015-06-01 ENCOUNTER — Ambulatory Visit: Payer: Self-pay | Admitting: Neurology

## 2015-06-11 ENCOUNTER — Telehealth: Payer: Self-pay | Admitting: Neurology

## 2015-06-11 ENCOUNTER — Encounter: Payer: Self-pay | Admitting: *Deleted

## 2015-06-11 ENCOUNTER — Telehealth: Payer: Self-pay | Admitting: *Deleted

## 2015-06-11 NOTE — Telephone Encounter (Signed)
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  Continue prednisone tapering, keep CellCept, follow-up June 29 2015

## 2015-06-11 NOTE — Telephone Encounter (Signed)
Spoke to Francisco Braun - says he started having severe muscle spasms on 06/08/15 in his left calf and left tricep.  On 06/10/15, he started having difficulty pronouncing his words and this morning his speech is more slurred.  He denies any difficulty swallowing or breathing.  He is taking his medications as prescribed, including the Prednisone (currently taking five tablets daily).

## 2015-06-11 NOTE — Telephone Encounter (Signed)
Patient is calling to get a work note to return to work on 06-14-15. Please call patient when note is ready. Thank you.

## 2015-06-11 NOTE — Telephone Encounter (Signed)
Letter is ready for pick up - patient aware.

## 2015-06-15 ENCOUNTER — Telehealth: Payer: Self-pay | Admitting: Neurology

## 2015-06-15 ENCOUNTER — Encounter: Payer: Self-pay | Admitting: *Deleted

## 2015-06-15 ENCOUNTER — Other Ambulatory Visit: Payer: Self-pay | Admitting: *Deleted

## 2015-06-15 DIAGNOSIS — G7000 Myasthenia gravis without (acute) exacerbation: Secondary | ICD-10-CM

## 2015-06-15 MED ORDER — PREDNISONE 10 MG PO TABS: ORAL_TABLET | ORAL | Status: DC

## 2015-06-15 NOTE — Telephone Encounter (Signed)
Per Dr. Krista Blue, new rx sent in for patient. He is aware of instructions.

## 2015-06-15 NOTE — Telephone Encounter (Signed)
Patient would like to speak with nurse regarding medication dosage and that he will be out of prednisone today. Best number to contact [phone]

## 2015-06-15 NOTE — Telephone Encounter (Signed)
Pt called and is going out of town this evening and needs refill predniSONE (DELTASONE) 10 MG tablet. He will be out town for 4 days . Please call and let pt know status [phone]

## 2015-06-15 NOTE — Telephone Encounter (Signed)
It appears multiple refills were provided at [facility] on 09/22.  I called the pharmacy.  Spoke with Verdis Frederickson.  She said the patient has several refills on file.  Says he picked up #100 on 09/22, and it is too soon to refill at this time.  It appears there is another message in the system, and RN spoke with patient verifying dose, MD sent Rx according to message.

## 2015-06-26 ENCOUNTER — Telehealth: Payer: Self-pay | Admitting: Neurology

## 2015-06-26 NOTE — Telephone Encounter (Signed)
Pt called and states he has cramps from his knee down for the last 3 days, his speech slurred as well. He says that his speech changed started yesterday. Please call and advise [phone]

## 2015-06-26 NOTE — Telephone Encounter (Signed)
Pt called back sts Dr Krista Blue has not called him. I relayed she was not in the office today but would checking messages.

## 2015-06-27 NOTE — Telephone Encounter (Signed)
I have called Francisco Braun, he complains of increased muscle cramps with mestinon 60mg  tid, I have advised him to decreased it to 1/2 tid, He has no limb muscle weakness, mild slurred speech intermittently.  He has follow up appt in Jul 01 2015.

## 2015-06-29 ENCOUNTER — Ambulatory Visit: Payer: BLUE CROSS/BLUE SHIELD | Admitting: Neurology

## 2015-07-01 ENCOUNTER — Encounter: Payer: Self-pay | Admitting: Neurology

## 2015-07-01 ENCOUNTER — Ambulatory Visit (INDEPENDENT_AMBULATORY_CARE_PROVIDER_SITE_OTHER): Payer: BLUE CROSS/BLUE SHIELD | Admitting: Neurology

## 2015-07-01 VITALS — BP 131/86 | HR 89 | Ht 72.0 in | Wt 163.0 lb

## 2015-07-01 DIAGNOSIS — G7000 Myasthenia gravis without (acute) exacerbation: Secondary | ICD-10-CM | POA: Diagnosis not present

## 2015-07-01 MED ORDER — MYCOPHENOLATE MOFETIL 500 MG PO TABS: 1500.0000 mg | ORAL_TABLET | Freq: Two times a day (BID) | ORAL | Status: DC

## 2015-07-01 NOTE — Patient Instructions (Signed)
Prednisone 10mg   Complete 2 tabs till Jul 05 2015.  Starting from Monday Jul 06 2015, take one tab every morning after breakfast for 2 weeks  Starting from July 20 2015, take half tablet of prednisone every morning  Increase CellCept to 500 mg 3 tablet twice a day  Keep Mestinon 60 mg half to 1 tablet 3 times a day as needed
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
## 2015-07-02 ENCOUNTER — Encounter: Payer: Self-pay | Admitting: *Deleted

## 2015-07-02 ENCOUNTER — Telehealth: Payer: Self-pay | Admitting: Neurology

## 2015-07-02 LAB — COMPREHENSIVE METABOLIC PANEL
ALBUMIN: 3.5 g/dL — AB (ref 3.6–4.8)
ALT: 19 IU/L (ref 0–44)
AST: 13 IU/L (ref 0–40)
Albumin/Globulin Ratio: 1.3 (ref 1.1–2.5)
Alkaline Phosphatase: 65 IU/L (ref 39–117)
BILIRUBIN TOTAL: 0.2 mg/dL (ref 0.0–1.2)
BUN / CREAT RATIO: 22 (ref 10–22)
BUN: 16 mg/dL (ref 8–27)
CO2: 26 mmol/L (ref 18–29)
CREATININE: 0.72 mg/dL — AB (ref 0.76–1.27)
Calcium: 8.3 mg/dL — ABNORMAL LOW (ref 8.6–10.2)
Chloride: 103 mmol/L (ref 97–106)
GFR, EST AFRICAN AMERICAN: 116 mL/min/{1.73_m2} (ref >59)
GFR, EST NON AFRICAN AMERICAN: 100 mL/min/{1.73_m2} (ref >59)
GLUCOSE: 104 mg/dL — AB (ref 65–99)
Globulin, Total: 2.7 g/dL (ref 1.5–4.5)
Potassium: 4.5 mmol/L (ref 3.5–5.2)
Sodium: 143 mmol/L (ref 136–144)
TOTAL PROTEIN: 6.2 g/dL (ref 6.0–8.5)

## 2015-07-02 LAB — CBC
HEMOGLOBIN: 13.9 g/dL (ref 12.6–17.7)
Hematocrit: 41.3 % (ref 37.5–51.0)
MCH: 30.5 pg (ref 26.6–33.0)
MCHC: 33.7 g/dL (ref 31.5–35.7)
MCV: 91 fL (ref 79–97)
Platelets: 257 10*3/uL (ref 150–379)
RBC: 4.56 x10E6/uL (ref 4.14–5.80)
RDW: 14.2 % (ref 12.3–15.4)
WBC: 11.8 10*3/uL — ABNORMAL HIGH (ref 3.4–10.8)

## 2015-07-02 LAB — HEMOGLOBIN A1C
Est. average glucose Bld gHb Est-mCnc: 117 mg/dL
Hgb A1c MFr Bld: 5.7 % — ABNORMAL HIGH (ref 4.8–5.6)

## 2015-07-02 NOTE — Telephone Encounter (Signed)
Patient needs a letter excusing him from jury duty.  Placed up front for pick up.

## 2015-07-02 NOTE — Telephone Encounter (Signed)
Please call patient, laboratory showed mild elevated A1c, 5.7, indicating mild elevated glucose level, average around 117, normal should be less than 99,  Also mild elevated WBC, this could be related to his prednisone use, check to see if he has any signs of infection, such as UTI, upper respiratory infection.

## 2015-07-02 NOTE — Telephone Encounter (Signed)
Patient returned your call. Patient request that you call back before 5pm [phone].

## 2015-07-20 ENCOUNTER — Telehealth: Payer: Self-pay | Admitting: Neurology

## 2015-07-20 NOTE — Telephone Encounter (Signed)
Patient is calling and states that since his Rx Cellcept 500 mg and Mestinon 60 mg has been increased he cannot talk correctly, it is affecting his speech.  Please call.

## 2015-07-20 NOTE — Telephone Encounter (Signed)
Left him a detailed message, on his personal voicemail, with the information below.  Left our number to call back with any further concerns or questions.

## 2015-07-20 NOTE — Telephone Encounter (Signed)
Spoke to patient - says he is trying to get at least 8 hours of sleep per day now.  He is agreeable to stay on medications, as prescribed.  In addition to the Cellcept and Mestinon, he is also taking prednisone 5mg  daily.  His speech started worsening again three days ago.  He is asking for any additive treatment that may help improve his speech.

## 2015-07-20 NOTE — Telephone Encounter (Signed)
I will go over treatment options in August 05 2015, if he needs more help before that, I can see him sooner, if he has no difficulty breathing, no generalized muscle weakness, no double vision,  I would suggest an keep current medications, he may try Mestinon 60 mg up to 4 tablets a day, taking Mestinon at that period of time when he needs to talk the most.

## 2015-07-23 NOTE — Telephone Encounter (Signed)
Error

## 2015-07-27 ENCOUNTER — Encounter: Payer: Self-pay | Admitting: Neurology

## 2015-07-27 ENCOUNTER — Ambulatory Visit (INDEPENDENT_AMBULATORY_CARE_PROVIDER_SITE_OTHER): Payer: BLUE CROSS/BLUE SHIELD | Admitting: Neurology

## 2015-07-27 VITALS — BP 122/73 | HR 92 | Ht 72.0 in | Wt 164.0 lb

## 2015-07-27 DIAGNOSIS — G7000 Myasthenia gravis without (acute) exacerbation: Secondary | ICD-10-CM

## 2015-07-27 NOTE — Telephone Encounter (Signed)
Pt called sts he is having dbl vision and it is blurry when is looks far off. This started after medication was increased and once starts wearing off the symptoms start to improve. He is requesting to see Dr Krista Blue today.Please call after 11am as he will be with atty this am

## 2015-07-27 NOTE — Telephone Encounter (Signed)
He has been worked into the schedule today.
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
## 2015-08-05 ENCOUNTER — Ambulatory Visit: Payer: BLUE CROSS/BLUE SHIELD | Admitting: Neurology

## 2015-08-07 ENCOUNTER — Other Ambulatory Visit: Payer: Self-pay | Admitting: Neurology

## 2015-10-05 ENCOUNTER — Telehealth: Payer: Self-pay | Admitting: Neurology

## 2015-10-05 NOTE — Telephone Encounter (Signed)
Per Dr. Krista Blue, it is ok for his to take Zyrtec.  Spoke to patient to let him know.  His speech is better. He will keep his pending appt on 10/28/15.

## 2015-10-05 NOTE — Telephone Encounter (Signed)
Pt called said he has a bad cold and wants to know what he can take OTC. He is experiencing coughing, head cold, some congestion in chest on 10/03/15. He started taking Zyrtec on Saturday and his head congestion is better but he is still experiencing chest congestion. He also said on Saturday and Sunday he could not pronounce s's and t's. He has not experienced that today. Please call.

## 2015-10-06 ENCOUNTER — Encounter: Payer: Self-pay | Admitting: Family Medicine

## 2015-10-06 ENCOUNTER — Ambulatory Visit (INDEPENDENT_AMBULATORY_CARE_PROVIDER_SITE_OTHER): Payer: BLUE CROSS/BLUE SHIELD | Admitting: Family Medicine

## 2015-10-06 VITALS — BP 117/77 | HR 115 | Temp 99.5°F | Ht 69.1 in | Wt 160.0 lb

## 2015-10-06 DIAGNOSIS — J069 Acute upper respiratory infection, unspecified: Secondary | ICD-10-CM

## 2015-10-06 MED ORDER — HYDROCOD POLST-CPM POLST ER 10-8 MG/5ML PO SUER: 5.0000 mL | Freq: Every evening | ORAL | Status: DC | PRN

## 2015-10-06 MED ORDER — BENZONATATE 200 MG PO CAPS: 200.0000 mg | ORAL_CAPSULE | Freq: Three times a day (TID) | ORAL | Status: DC | PRN

## 2015-10-06 NOTE — Progress Notes (Signed)
BP 117/77 mmHg  Pulse 115  Temp(Src) 99.5 F (37.5 C)  Ht 5' 9.1" (1.755 m)  Wt 160 lb (72.576 kg)  BMI 23.56 kg/m2  SpO2 95%   Subjective:    Patient ID: Francisco Braun, male    DOB: 1951/06/12, 65 y.o.   MRN: [id number]  HPI: Francisco Braun is a 65 y.o. male  Chief Complaint  Patient presents with  . URI    X 3 days   UPPER RESPIRATORY TRACT INFECTION Duration: 3 days Worst symptom: Congestion Fever: no Cough: yes Shortness of breath: yes Wheezing: no Chest pain: no Chest tightness: no Chest congestion: no Nasal congestion: yes Runny nose: yes Post nasal drip: yes Sneezing: yes Sore throat: no Swollen glands: no Sinus pressure: no Headache: no Face pain: no Toothache: no Ear pain: no  Ear pressure: no  Eyes red/itching:no Eye drainage/crusting: no  Vomiting: no Rash: no Fatigue: yes Sick contacts: no Strep contacts: no  Context: better Recurrent sinusitis: no Relief with OTC cold/cough medications: no  Treatments attempted: anti-histamine   Relevant past medical, surgical, family and social history reviewed and updated as indicated. Interim medical history since our last visit reviewed. Allergies and medications reviewed and updated.  Review of Systems  Constitutional: Negative.   HENT: Negative.   Respiratory: Negative.   Cardiovascular: Negative.   Psychiatric/Behavioral: Negative.     Per HPI unless specifically indicated above     Objective:    BP 117/77 mmHg  Pulse 115  Temp(Src) 99.5 F (37.5 C)  Ht 5' 9.1" (1.755 m)  Wt 160 lb (72.576 kg)  BMI 23.56 kg/m2  SpO2 95%  Wt Readings from Last 3 Encounters:  10/06/15 160 lb (72.576 kg)  07/27/15 164 lb (74.39 kg)  07/01/15 163 lb (73.936 kg)    Physical Exam  Constitutional: He is oriented to person, place, and time. He appears well-developed and well-nourished. No distress.  HENT:  Head: Normocephalic and atraumatic.  Right Ear: Hearing and external ear normal.  Left Ear:  Hearing and external ear normal.  Nose: Nose normal.  Mouth/Throat: Oropharynx is clear and moist. No oropharyngeal exudate.  Eyes: Conjunctivae, EOM and lids are normal. Pupils are equal, round, and reactive to light. Right eye exhibits no discharge. Left eye exhibits no discharge. No scleral icterus.  Neck: Normal range of motion. Neck supple. No JVD present. No tracheal deviation present. No thyromegaly present.  Cardiovascular: Normal rate, regular rhythm and intact distal pulses.  Exam reveals no gallop and no friction rub.   No murmur heard. Pulmonary/Chest: Effort normal and breath sounds normal. No stridor. No respiratory distress. He has no wheezes. He has no rales. He exhibits no tenderness.  Musculoskeletal: Normal range of motion.  Lymphadenopathy:    He has cervical adenopathy.  Neurological: He is alert and oriented to person, place, and time.  Skin: Skin is warm, dry and intact. No rash noted. He is not diaphoretic. No erythema. No pallor.  Psychiatric: He has a normal mood and affect. His speech is normal and behavior is normal. Judgment and thought content normal. Cognition and memory are normal.  Nursing note and vitals reviewed.   Results for orders placed or performed in visit on 07/01/15  CBC  Result Value Ref Range   WBC 11.8 (H) 3.4 - 10.8 x10E3/uL   RBC 4.56 4.14 - 5.80 x10E6/uL   Hemoglobin 13.9 12.6 - 17.7 g/dL   Hematocrit 41.3 37.5 - 51.0 %   MCV 91 79 - 97  fL   MCH 30.5 26.6 - 33.0 pg   MCHC 33.7 31.5 - 35.7 g/dL   RDW 14.2 12.3 - 15.4 %   Platelets 257 150 - 379 x10E3/uL  Comprehensive metabolic panel  Result Value Ref Range   Glucose 104 (H) 65 - 99 mg/dL   BUN 16 8 - 27 mg/dL   Creatinine, Ser 0.72 (L) 0.76 - 1.27 mg/dL   GFR calc non Af Amer 100 >59 mL/min/1.73   GFR calc Af Amer 116 >59 mL/min/1.73   BUN/Creatinine Ratio 22 10 - 22   Sodium 143 136 - 144 mmol/L   Potassium 4.5 3.5 - 5.2 mmol/L   Chloride 103 97 - 106 mmol/L   CO2 26 18 - 29  mmol/L   Calcium 8.3 (L) 8.6 - 10.2 mg/dL   Total Protein 6.2 6.0 - 8.5 g/dL   Albumin 3.5 (L) 3.6 - 4.8 g/dL   Globulin, Total 2.7 1.5 - 4.5 g/dL   Albumin/Globulin Ratio 1.3 1.1 - 2.5   Bilirubin Total 0.2 0.0 - 1.2 mg/dL   Alkaline Phosphatase 65 39 - 117 IU/L   AST 13 0 - 40 IU/L   ALT 19 0 - 44 IU/L  Hemoglobin A1c  Result Value Ref Range   Hgb A1c MFr Bld 5.7 (H) 4.8 - 5.6 %   Est. average glucose Bld gHb Est-mCnc 117 mg/dL      Assessment & Plan:   Problem List Items Addressed This Visit    None    Visit Diagnoses    Upper respiratory infection    -  Primary    No sign of bacterial infection. Will treat cough with tussionex and tessalon perles. Do not drive on this medicine. Call if not getting better or getting worse.        Follow up plan: Return if symptoms worsen or fail to improve.

## 2015-10-14 ENCOUNTER — Telehealth: Payer: Self-pay | Admitting: Neurology

## 2015-10-14 ENCOUNTER — Telehealth: Payer: Self-pay | Admitting: Family Medicine

## 2015-10-14 NOTE — Telephone Encounter (Signed)
Spoke to patient - he is going to call his PCP who evaluated him and confirmed his sinus infection for a work note.

## 2015-10-14 NOTE — Telephone Encounter (Signed)
Pt came in stated he still has a bad cough wants to know if he can get a refill on:  Benzonatate   Pharm is [facility] in [place]  Pt would also like a work excuse note through Saturday without a reason listed.

## 2015-10-14 NOTE — Telephone Encounter (Signed)
Patient called back, states he needs to know something in the next 30 minutes about work note, will need note by 5pm today.

## 2015-10-14 NOTE — Telephone Encounter (Signed)
Patient called to request note to be out of work 3 days, states his PCP said he has a bad sinus infection, "feels so weak and is seeing double vision".

## 2015-10-15 ENCOUNTER — Ambulatory Visit (INDEPENDENT_AMBULATORY_CARE_PROVIDER_SITE_OTHER): Payer: BLUE CROSS/BLUE SHIELD | Admitting: Family Medicine

## 2015-10-15 ENCOUNTER — Encounter: Payer: Self-pay | Admitting: Family Medicine

## 2015-10-15 VITALS — BP 123/85 | HR 73 | Temp 97.8°F | Ht 69.8 in | Wt 161.2 lb

## 2015-10-15 DIAGNOSIS — R05 Cough: Secondary | ICD-10-CM

## 2015-10-15 DIAGNOSIS — J209 Acute bronchitis, unspecified: Secondary | ICD-10-CM | POA: Diagnosis not present

## 2015-10-15 DIAGNOSIS — R059 Cough, unspecified: Secondary | ICD-10-CM

## 2015-10-15 MED ORDER — ALBUTEROL SULFATE HFA 108 (90 BASE) MCG/ACT IN AERS: 2.0000 | INHALATION_SPRAY | Freq: Four times a day (QID) | RESPIRATORY_TRACT | Status: DC | PRN

## 2015-10-15 MED ORDER — DOXYCYCLINE HYCLATE 100 MG PO TABS: 100.0000 mg | ORAL_TABLET | Freq: Two times a day (BID) | ORAL | Status: DC

## 2015-10-15 MED ORDER — BENZONATATE 200 MG PO CAPS: 200.0000 mg | ORAL_CAPSULE | Freq: Three times a day (TID) | ORAL | Status: DC | PRN

## 2015-10-15 NOTE — Progress Notes (Signed)
BP 123/85 mmHg  Pulse 73  Temp(Src) 97.8 F (36.6 C)  Ht 5' 9.8" (1.773 m)  Wt 161 lb 3.2 oz (73.12 kg)  BMI 23.26 kg/m2  SpO2 96%   Subjective:    Patient ID: Francisco Braun, male    DOB: 1951/06/16, 65 y.o.   MRN: [id number]  HPI: Francisco Braun is a 65 y.o. male  Chief Complaint  Patient presents with  . Cough    pt states he has a cough that he cannot get rid of, states it is worse during the day   UPPER RESPIRATORY TRACT INFECTION Worst symptom: cough Fever: no, chills and sweats Cough: yes Shortness of breath: yes Wheezing: yes Chest pain: yes, with cough Chest tightness: no Chest congestion: yes Nasal congestion: no Runny nose: no Post nasal drip: no Sneezing: yes Sore throat: no Swollen glands: no Sinus pressure: no Headache: yes Face pain: no Toothache: no Ear pain: no  Ear pressure: no  Eyes red/itching:no Eye drainage/crusting: no  Vomiting: no Rash: no Fatigue: no Sick contacts: no Strep contacts: no  Context: better Recurrent sinusitis: no Relief with OTC cold/cough medications: no  Treatments attempted: cold/sinus, mucinex, anti-histamine, pseudoephedrine, cough syrup and antibiotics   Relevant past medical, surgical, family and social history reviewed and updated as indicated. Interim medical history since our last visit reviewed. Allergies and medications reviewed and updated.  Review of Systems  Constitutional: Negative.   HENT: Positive for congestion, postnasal drip, rhinorrhea and sinus pressure. Negative for dental problem, drooling, ear discharge, ear pain, facial swelling, hearing loss, mouth sores, nosebleeds, sneezing, sore throat, tinnitus, trouble swallowing and voice change.   Respiratory: Positive for cough, chest tightness, shortness of breath and wheezing. Negative for apnea, choking and stridor.   Cardiovascular: Negative.   Psychiatric/Behavioral: Negative.     Per HPI unless specifically indicated above      Objective:    BP 123/85 mmHg  Pulse 73  Temp(Src) 97.8 F (36.6 C)  Ht 5' 9.8" (1.773 m)  Wt 161 lb 3.2 oz (73.12 kg)  BMI 23.26 kg/m2  SpO2 96%  Wt Readings from Last 3 Encounters:  10/15/15 161 lb 3.2 oz (73.12 kg)  10/06/15 160 lb (72.576 kg)  07/27/15 164 lb (74.39 kg)    Physical Exam  Constitutional: He is oriented to person, place, and time. He appears well-developed and well-nourished. No distress.  HENT:  Head: Normocephalic and atraumatic.  Right Ear: Hearing and external ear normal.  Left Ear: Hearing and external ear normal.  Nose: Nose normal.  Mouth/Throat: Oropharynx is clear and moist. No oropharyngeal exudate.  Eyes: Conjunctivae, EOM and lids are normal. Pupils are equal, round, and reactive to light. Right eye exhibits no discharge. Left eye exhibits no discharge. No scleral icterus.  Neck: Normal range of motion. Neck supple. No JVD present. No tracheal deviation present. No thyromegaly present.  Cardiovascular: Normal rate, regular rhythm, normal heart sounds and intact distal pulses.  Exam reveals no gallop and no friction rub.   No murmur heard. Pulmonary/Chest: Effort normal. No stridor. No respiratory distress. He has wheezes in the right middle field, the right lower field, the left middle field and the left lower field. He has rhonchi in the right lower field. He has no rales. He exhibits no tenderness.  Musculoskeletal: Normal range of motion.  Lymphadenopathy:    He has no cervical adenopathy.  Neurological: He is alert and oriented to person, place, and time.  Skin: Skin is warm, dry and  intact. No rash noted. No erythema. No pallor.  Psychiatric: He has a normal mood and affect. His speech is normal and behavior is normal. Judgment and thought content normal. Cognition and memory are normal.  Nursing note and vitals reviewed.   Results for orders placed or performed in visit on 07/01/15  CBC  Result Value Ref Range   WBC 11.8 (H) 3.4 - 10.8  x10E3/uL   RBC 4.56 4.14 - 5.80 x10E6/uL   Hemoglobin 13.9 12.6 - 17.7 g/dL   Hematocrit 41.3 37.5 - 51.0 %   MCV 91 79 - 97 fL   MCH 30.5 26.6 - 33.0 pg   MCHC 33.7 31.5 - 35.7 g/dL   RDW 14.2 12.3 - 15.4 %   Platelets 257 150 - 379 x10E3/uL  Comprehensive metabolic panel  Result Value Ref Range   Glucose 104 (H) 65 - 99 mg/dL   BUN 16 8 - 27 mg/dL   Creatinine, Ser 0.72 (L) 0.76 - 1.27 mg/dL   GFR calc non Af Amer 100 >59 mL/min/1.73   GFR calc Af Amer 116 >59 mL/min/1.73   BUN/Creatinine Ratio 22 10 - 22   Sodium 143 136 - 144 mmol/L   Potassium 4.5 3.5 - 5.2 mmol/L   Chloride 103 97 - 106 mmol/L   CO2 26 18 - 29 mmol/L   Calcium 8.3 (L) 8.6 - 10.2 mg/dL   Total Protein 6.2 6.0 - 8.5 g/dL   Albumin 3.5 (L) 3.6 - 4.8 g/dL   Globulin, Total 2.7 1.5 - 4.5 g/dL   Albumin/Globulin Ratio 1.3 1.1 - 2.5   Bilirubin Total 0.2 0.0 - 1.2 mg/dL   Alkaline Phosphatase 65 39 - 117 IU/L   AST 13 0 - 40 IU/L   ALT 19 0 - 44 IU/L  Hemoglobin A1c  Result Value Ref Range   Hgb A1c MFr Bld 5.7 (H) 4.8 - 5.6 %   Est. average glucose Bld gHb Est-mCnc 117 mg/dL      Assessment & Plan:   Problem List Items Addressed This Visit    None    Visit Diagnoses    Acute bronchitis, unspecified organism    -  Primary    Will treat with doxycycline and inhaler. Conitnue tussionex and tessalon perles. Recheck lungs in 2 weeks. Work note given.     Cough        Will check spiro. await results.     Relevant Orders    Spirometry with Graph (Completed)        Follow up plan: Return in about 2 weeks (around 10/29/2015) for Lung recheck.

## 2015-10-15 NOTE — Telephone Encounter (Signed)
Pt decided to come in for an appt this morning. Thanks.

## 2015-10-15 NOTE — Telephone Encounter (Signed)
Tessalon perles sent to his pharmacy. I saw him 10/06/15- If he wants a note through 10/10/15 that is fine. I cannot give him a note through 10/17/15 as he was not seen again.

## 2015-10-28 ENCOUNTER — Ambulatory Visit (INDEPENDENT_AMBULATORY_CARE_PROVIDER_SITE_OTHER): Payer: BLUE CROSS/BLUE SHIELD | Admitting: Neurology

## 2015-10-28 ENCOUNTER — Encounter: Payer: Self-pay | Admitting: Neurology

## 2015-10-28 VITALS — BP 123/78 | HR 73 | Ht 69.0 in | Wt 166.0 lb

## 2015-10-28 DIAGNOSIS — G7000 Myasthenia gravis without (acute) exacerbation: Secondary | ICD-10-CM | POA: Diagnosis not present

## 2015-10-28 MED ORDER — PYRIDOSTIGMINE BROMIDE 60 MG PO TABS: 60.0000 mg | ORAL_TABLET | Freq: Four times a day (QID) | ORAL | Status: DC

## 2015-10-28 MED ORDER — PREDNISONE 1 MG PO TABS: 2.0000 mg | ORAL_TABLET | Freq: Every day | ORAL | Status: DC
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
## 2015-10-29 LAB — TSH: TSH: 0.695 u[IU]/mL (ref 0.450–4.500)

## 2015-10-29 LAB — CBC
Hematocrit: 42.4 % (ref 37.5–51.0)
Hemoglobin: 14.1 g/dL (ref 12.6–17.7)
MCH: 29.6 pg (ref 26.6–33.0)
MCHC: 33.3 g/dL (ref 31.5–35.7)
MCV: 89 fL (ref 79–97)
Platelets: 248 10*3/uL (ref 150–379)
RBC: 4.77 x10E6/uL (ref 4.14–5.80)
RDW: 14.7 % (ref 12.3–15.4)
WBC: 7.4 10*3/uL (ref 3.4–10.8)

## 2015-10-29 LAB — COMPREHENSIVE METABOLIC PANEL
A/G RATIO: 1.9 (ref 1.1–2.5)
ALBUMIN: 3.9 g/dL (ref 3.6–4.8)
ALK PHOS: 72 IU/L (ref 39–117)
ALT: 16 IU/L (ref 0–44)
AST: 18 IU/L (ref 0–40)
BUN / CREAT RATIO: 11 (ref 10–22)
BUN: 9 mg/dL (ref 8–27)
Bilirubin Total: 0.3 mg/dL (ref 0.0–1.2)
CO2: 25 mmol/L (ref 18–29)
CREATININE: 0.85 mg/dL (ref 0.76–1.27)
Calcium: 9.1 mg/dL (ref 8.6–10.2)
Chloride: 104 mmol/L (ref 96–106)
GFR calc Af Amer: 108 mL/min/{1.73_m2} (ref >59)
GFR, EST NON AFRICAN AMERICAN: 93 mL/min/{1.73_m2} (ref >59)
GLOBULIN, TOTAL: 2.1 g/dL (ref 1.5–4.5)
Glucose: 102 mg/dL — ABNORMAL HIGH (ref 65–99)
Potassium: 4.4 mmol/L (ref 3.5–5.2)
SODIUM: 143 mmol/L (ref 134–144)
Total Protein: 6 g/dL (ref 6.0–8.5)

## 2015-10-29 LAB — HGB A1C W/O EAG: Hgb A1c MFr Bld: 5.7 % — ABNORMAL HIGH (ref 4.8–5.6)

## 2015-10-29 NOTE — Telephone Encounter (Signed)
Spoke to patient he is aware of results.

## 2015-10-29 NOTE — Telephone Encounter (Signed)
Please call patient, laboratory evaluation has showed mild elevated A1c 5.7, which stayed same as previous evaluation, mild elevated glucose 102 on CMP, rest of the laboratory evaluation TSH, CBC was normal

## 2015-10-30 ENCOUNTER — Ambulatory Visit (INDEPENDENT_AMBULATORY_CARE_PROVIDER_SITE_OTHER): Payer: BLUE CROSS/BLUE SHIELD | Admitting: Family Medicine

## 2015-10-30 ENCOUNTER — Encounter: Payer: Self-pay | Admitting: Family Medicine

## 2015-10-30 VITALS — BP 111/75 | HR 83 | Temp 98.5°F | Ht 69.0 in | Wt 165.6 lb

## 2015-10-30 DIAGNOSIS — J40 Bronchitis, not specified as acute or chronic: Secondary | ICD-10-CM

## 2015-10-30 DIAGNOSIS — G7000 Myasthenia gravis without (acute) exacerbation: Secondary | ICD-10-CM

## 2015-10-30 DIAGNOSIS — R05 Cough: Secondary | ICD-10-CM

## 2015-10-30 DIAGNOSIS — R059 Cough, unspecified: Secondary | ICD-10-CM

## 2015-10-30 MED ORDER — DOXYCYCLINE HYCLATE 100 MG PO TABS: 100.0000 mg | ORAL_TABLET | Freq: Two times a day (BID) | ORAL | Status: AC

## 2015-10-30 MED ORDER — BENZONATATE 100 MG PO CAPS: 100.0000 mg | ORAL_CAPSULE | Freq: Three times a day (TID) | ORAL | Status: AC | PRN
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
## 2015-10-30 NOTE — Patient Instructions (Addendum)
I will suggest quinoa as a plant-based protein source that has all the essential amino acids  Start back on the antibiotic Use the benzonatate for cough if needed  Try vitamin C (orange juice if not diabetic or vitamin C tablets) and drink green tea to help your immune system during your illness Get plenty of rest and hydration  Call or go to urgent care of the ER if getting worse

## 2015-11-04 ENCOUNTER — Other Ambulatory Visit: Payer: Self-pay | Admitting: *Deleted

## 2015-11-04 MED ORDER — MYCOPHENOLATE MOFETIL 500 MG PO TABS: 1500.0000 mg | ORAL_TABLET | Freq: Two times a day (BID) | ORAL | Status: DC

## 2015-11-10 NOTE — Assessment & Plan Note (Signed)
Suspect immunosuppression increasing risk of infection; start doxy; continue prednisone per neurologist's recommendation; as far as protein goes, I cannot imagine that 6-12 raw eggs is a good idea; suggested instead quinoa, plant-based protein

## 2015-12-17 ENCOUNTER — Telehealth: Payer: Self-pay | Admitting: Neurology

## 2015-12-17 NOTE — Telephone Encounter (Signed)
Patient called to advise only has enough mycophenolate (CELLCEPT) 500 MG tablet until lunchtime tomorrow, automated recording stated it wasn't ready for rotation.

## 2015-12-17 NOTE — Telephone Encounter (Signed)
Last rx sent into pharmacy on 11/04/2015 with 3 additional refills - left him a message letting him know he just needs to contact the pharmacy for a refill.

## 2016-02-24 ENCOUNTER — Ambulatory Visit (INDEPENDENT_AMBULATORY_CARE_PROVIDER_SITE_OTHER): Payer: BLUE CROSS/BLUE SHIELD | Admitting: Neurology

## 2016-02-24 ENCOUNTER — Encounter: Payer: Self-pay | Admitting: Neurology

## 2016-02-24 VITALS — BP 113/71 | HR 67 | Ht 69.0 in | Wt 168.0 lb

## 2016-02-24 DIAGNOSIS — R7309 Other abnormal glucose: Secondary | ICD-10-CM | POA: Diagnosis not present

## 2016-02-24 DIAGNOSIS — G7000 Myasthenia gravis without (acute) exacerbation: Secondary | ICD-10-CM | POA: Diagnosis not present
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
## 2016-02-25 LAB — HGB A1C W/O EAG: Hgb A1c MFr Bld: 5.6 % (ref 4.8–5.6)

## 2016-03-21 ENCOUNTER — Other Ambulatory Visit: Payer: Self-pay

## 2016-03-21 MED ORDER — MONTELUKAST SODIUM 10 MG PO TABS: 10.0000 mg | ORAL_TABLET | Freq: Every day | ORAL | Status: DC

## 2016-03-21 NOTE — Telephone Encounter (Signed)
90 day

## 2016-05-25 ENCOUNTER — Ambulatory Visit: Payer: BLUE CROSS/BLUE SHIELD | Admitting: Neurology

## 2016-05-31 ENCOUNTER — Telehealth: Payer: Self-pay

## 2016-05-31 MED ORDER — EPINEPHRINE 0.3 MG/0.3ML IJ SOAJ: 0.3000 mg | Freq: Once | INTRAMUSCULAR | 0 refills | Status: AC

## 2016-05-31 NOTE — Telephone Encounter (Signed)
Pt needs refill on epi pen that is not on current med list?

## 2016-05-31 NOTE — Telephone Encounter (Signed)
Added and sent

## 2016-06-27 ENCOUNTER — Ambulatory Visit (INDEPENDENT_AMBULATORY_CARE_PROVIDER_SITE_OTHER): Payer: BLUE CROSS/BLUE SHIELD | Admitting: Neurology

## 2016-06-27 ENCOUNTER — Encounter: Payer: Self-pay | Admitting: Neurology

## 2016-06-27 VITALS — BP 131/83 | HR 67 | Ht 69.0 in | Wt 163.0 lb

## 2016-06-27 DIAGNOSIS — G7000 Myasthenia gravis without (acute) exacerbation: Secondary | ICD-10-CM | POA: Diagnosis not present

## 2016-06-27 MED ORDER — MYCOPHENOLATE MOFETIL 500 MG PO TABS: 1500.0000 mg | ORAL_TABLET | Freq: Two times a day (BID) | ORAL | 4 refills | Status: DC

## 2016-06-27 NOTE — Patient Instructions (Addendum)
Take prednisone 1 mg every morning for 2 weeks then stop  Decreased pyridostigmine (MESTINON) 60 MG tablet to 1 tablet 3 times a day for 2-3 weeks   Than half tablet 3 times a day for 3 weeks, then stop
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
## 2016-06-28 LAB — COMPREHENSIVE METABOLIC PANEL
ALBUMIN: 4.1 g/dL (ref 3.6–4.8)
ALT: 16 IU/L (ref 0–44)
AST: 15 IU/L (ref 0–40)
Albumin/Globulin Ratio: 1.9 (ref 1.2–2.2)
Alkaline Phosphatase: 86 IU/L (ref 39–117)
BUN / CREAT RATIO: 11 (ref 10–24)
BUN: 8 mg/dL (ref 8–27)
Bilirubin Total: 0.3 mg/dL (ref 0.0–1.2)
CALCIUM: 9.2 mg/dL (ref 8.6–10.2)
CO2: 25 mmol/L (ref 18–29)
CREATININE: 0.76 mg/dL (ref 0.76–1.27)
Chloride: 105 mmol/L (ref 96–106)
GFR, EST AFRICAN AMERICAN: 112 mL/min/{1.73_m2} (ref >59)
GFR, EST NON AFRICAN AMERICAN: 97 mL/min/{1.73_m2} (ref >59)
GLOBULIN, TOTAL: 2.2 g/dL (ref 1.5–4.5)
Glucose: 94 mg/dL (ref 65–99)
Potassium: 4.8 mmol/L (ref 3.5–5.2)
SODIUM: 143 mmol/L (ref 134–144)
TOTAL PROTEIN: 6.3 g/dL (ref 6.0–8.5)

## 2016-06-28 LAB — CBC
HEMOGLOBIN: 14.7 g/dL (ref 12.6–17.7)
Hematocrit: 43.3 % (ref 37.5–51.0)
MCH: 30.1 pg (ref 26.6–33.0)
MCHC: 33.9 g/dL (ref 31.5–35.7)
MCV: 89 fL (ref 79–97)
Platelets: 251 10*3/uL (ref 150–379)
RBC: 4.88 x10E6/uL (ref 4.14–5.80)
RDW: 14.9 % (ref 12.3–15.4)
WBC: 7 10*3/uL (ref 3.4–10.8)

## 2016-09-05 HISTORY — PX: SKIN CANCER EXCISION: SHX779

## 2016-10-17 ENCOUNTER — Other Ambulatory Visit: Payer: Self-pay | Admitting: Neurology

## 2016-12-09 ENCOUNTER — Encounter: Payer: Self-pay | Admitting: Family Medicine

## 2016-12-09 ENCOUNTER — Ambulatory Visit (INDEPENDENT_AMBULATORY_CARE_PROVIDER_SITE_OTHER): Payer: BLUE CROSS/BLUE SHIELD | Admitting: Family Medicine

## 2016-12-09 VITALS — BP 128/78 | HR 89 | Temp 98.0°F | Resp 14 | Wt 172.0 lb

## 2016-12-09 DIAGNOSIS — R059 Cough, unspecified: Secondary | ICD-10-CM

## 2016-12-09 DIAGNOSIS — D899 Disorder involving the immune mechanism, unspecified: Secondary | ICD-10-CM | POA: Diagnosis not present

## 2016-12-09 DIAGNOSIS — D849 Immunodeficiency, unspecified: Secondary | ICD-10-CM

## 2016-12-09 DIAGNOSIS — R05 Cough: Secondary | ICD-10-CM | POA: Diagnosis not present

## 2016-12-09 MED ORDER — DOXYCYCLINE HYCLATE 100 MG PO TABS: 100.0000 mg | ORAL_TABLET | Freq: Two times a day (BID) | ORAL | 0 refills | Status: DC

## 2016-12-09 NOTE — Patient Instructions (Signed)
Start the antibiotics Please do eat yogurt daily or take a probiotic daily for the next month We want to replace the healthy germs in the gut If you notice foul, watery diarrhea in the next two months, schedule an appointment RIGHT AWAY Try vitamin C (orange juice if not diabetic or vitamin C tablets) and drink green tea to help your immune system during your illness Get plenty of rest and hydration
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
## 2016-12-19 ENCOUNTER — Ambulatory Visit
Admission: RE | Admit: 2016-12-19 | Discharge: 2016-12-19 | Disposition: A | Discharge status: Home / Self Care | Payer: BLUE CROSS/BLUE SHIELD | Source: Ambulatory Visit | Attending: Family Medicine | Admitting: Family Medicine

## 2016-12-19 ENCOUNTER — Ambulatory Visit (INDEPENDENT_AMBULATORY_CARE_PROVIDER_SITE_OTHER): Payer: BLUE CROSS/BLUE SHIELD | Admitting: Family Medicine

## 2016-12-19 ENCOUNTER — Encounter: Payer: Self-pay | Admitting: Family Medicine

## 2016-12-19 VITALS — BP 122/62 | HR 100 | Temp 97.9°F | Resp 16 | Wt 165.0 lb

## 2016-12-19 DIAGNOSIS — J180 Bronchopneumonia, unspecified organism: Secondary | ICD-10-CM

## 2016-12-19 DIAGNOSIS — R05 Cough: Secondary | ICD-10-CM | POA: Diagnosis not present

## 2016-12-19 DIAGNOSIS — R059 Cough, unspecified: Secondary | ICD-10-CM

## 2016-12-19 MED ORDER — ALBUTEROL SULFATE HFA 108 (90 BASE) MCG/ACT IN AERS: 2.0000 | INHALATION_SPRAY | RESPIRATORY_TRACT | 1 refills | Status: DC | PRN

## 2016-12-19 MED ORDER — LEVOFLOXACIN 500 MG PO TABS: 500.0000 mg | ORAL_TABLET | Freq: Every day | ORAL | 0 refills | Status: DC
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
## 2016-12-19 NOTE — Patient Instructions (Signed)
Start the new antibiotic Use the inhaler when needed per instructions Have the chest xray done across the street Call me or go to urgent care if getting worse

## 2016-12-26 ENCOUNTER — Encounter: Payer: Self-pay | Admitting: Neurology

## 2016-12-26 ENCOUNTER — Ambulatory Visit (INDEPENDENT_AMBULATORY_CARE_PROVIDER_SITE_OTHER): Payer: BLUE CROSS/BLUE SHIELD | Admitting: Neurology

## 2016-12-26 VITALS — BP 122/82 | HR 83 | Ht 69.0 in | Wt 171.0 lb

## 2016-12-26 DIAGNOSIS — G7000 Myasthenia gravis without (acute) exacerbation: Secondary | ICD-10-CM | POA: Diagnosis not present
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
## 2016-12-27 LAB — CBC WITH DIFFERENTIAL/PLATELET
BASOS ABS: 0 10*3/uL (ref 0.0–0.2)
Basos: 0 %
EOS (ABSOLUTE): 0.1 10*3/uL (ref 0.0–0.4)
Eos: 2 %
Hematocrit: 43.4 % (ref 37.5–51.0)
Hemoglobin: 14.7 g/dL (ref 13.0–17.7)
Immature Grans (Abs): 0.1 10*3/uL (ref 0.0–0.1)
Immature Granulocytes: 1 %
LYMPHS ABS: 1.3 10*3/uL (ref 0.7–3.1)
Lymphs: 18 %
MCH: 30 pg (ref 26.6–33.0)
MCHC: 33.9 g/dL (ref 31.5–35.7)
MCV: 89 fL (ref 79–97)
MONOS ABS: 0.8 10*3/uL (ref 0.1–0.9)
Monocytes: 11 %
Neutrophils Absolute: 4.9 10*3/uL (ref 1.4–7.0)
Neutrophils: 68 %
Platelets: 298 10*3/uL (ref 150–379)
RBC: 4.9 x10E6/uL (ref 4.14–5.80)
RDW: 13.9 % (ref 12.3–15.4)
WBC: 7.3 10*3/uL (ref 3.4–10.8)

## 2016-12-27 LAB — COMPREHENSIVE METABOLIC PANEL
A/G RATIO: 1.8 (ref 1.2–2.2)
ALBUMIN: 4 g/dL (ref 3.6–4.8)
ALK PHOS: 89 IU/L (ref 39–117)
ALT: 9 IU/L (ref 0–44)
AST: 12 IU/L (ref 0–40)
BUN / CREAT RATIO: 10 (ref 10–24)
BUN: 9 mg/dL (ref 8–27)
Bilirubin Total: 0.2 mg/dL (ref 0.0–1.2)
CHLORIDE: 102 mmol/L (ref 96–106)
CO2: 24 mmol/L (ref 18–29)
CREATININE: 0.88 mg/dL (ref 0.76–1.27)
Calcium: 9.5 mg/dL (ref 8.6–10.2)
GFR calc Af Amer: 105 mL/min/{1.73_m2} (ref >59)
GFR calc non Af Amer: 91 mL/min/{1.73_m2} (ref >59)
GLOBULIN, TOTAL: 2.2 g/dL (ref 1.5–4.5)
Glucose: 90 mg/dL (ref 65–99)
POTASSIUM: 4.7 mmol/L (ref 3.5–5.2)
SODIUM: 141 mmol/L (ref 134–144)
Total Protein: 6.2 g/dL (ref 6.0–8.5)

## 2017-01-16 ENCOUNTER — Encounter: Payer: Self-pay | Admitting: Family Medicine

## 2017-01-16 ENCOUNTER — Ambulatory Visit (INDEPENDENT_AMBULATORY_CARE_PROVIDER_SITE_OTHER): Payer: BLUE CROSS/BLUE SHIELD | Admitting: Family Medicine

## 2017-01-16 VITALS — BP 136/70 | HR 100 | Temp 98.2°F | Resp 18 | Ht 69.0 in | Wt 170.6 lb

## 2017-01-16 DIAGNOSIS — W57XXXA Bitten or stung by nonvenomous insect and other nonvenomous arthropods, initial encounter: Secondary | ICD-10-CM | POA: Diagnosis not present

## 2017-01-16 DIAGNOSIS — M2550 Pain in unspecified joint: Secondary | ICD-10-CM | POA: Diagnosis not present

## 2017-01-16 DIAGNOSIS — S70361A Insect bite (nonvenomous), right thigh, initial encounter: Secondary | ICD-10-CM | POA: Diagnosis not present

## 2017-01-16 MED ORDER — DOXYCYCLINE HYCLATE 50 MG PO CAPS: 100.0000 mg | ORAL_CAPSULE | Freq: Two times a day (BID) | ORAL | 0 refills | Status: AC

## 2017-01-16 NOTE — Patient Instructions (Addendum)
Tick Bite Information Introduction Ticks are insects that attach themselves to the skin. There are many types of ticks. Common types include wood ticks and deer ticks. Sometimes, ticks carry diseases that can make a person very ill. The most common places for ticks to attach themselves are the scalp, neck, armpits, waist, and groin. HOW CAN YOU PREVENT TICK BITES? Take these steps to help prevent tick bites when you are outdoors:  Wear long sleeves and long pants.  Wear white clothes so you can see ticks more easily.  Tuck your pant legs into your socks.  If walking on a trail, stay in the middle of the trail to avoid brushing against bushes.  Avoid walking through areas with long grass.  Put bug spray on all skin that is showing and along boot tops, pant legs, and sleeve cuffs.  Check clothes, hair, and skin often and before going inside.  Brush off any ticks that are not attached.  Take a shower or bath as soon as possible after being outdoors. HOW SHOULD YOU REMOVE A TICK? Ticks should be removed as soon as possible to help prevent diseases. 1. If latex gloves are available, put them on before trying to remove a tick. 2. Use tweezers to grasp the tick as close to the skin as possible. You may also use curved forceps or a tick removal tool. Grasp the tick as close to its head as possible. Avoid grasping the tick on its body. 3. Pull gently upward until the tick lets go. Do not twist the tick or jerk it suddenly. This may break off the tick's head or mouth parts. 4. Do not squeeze or crush the tick's body. This could force disease-carrying fluids from the tick into your body. 5. After the tick is removed, wash the bite area and your hands with soap and water or alcohol. 6. Apply a small amount of antiseptic cream or ointment to the bite site. 7. Wash any tools that were used. Do not try to remove a tick by applying a hot match, petroleum jelly, or fingernail polish to the tick. These  methods do not work. They may also increase the chances of disease being spread from the tick bite. WHEN SHOULD YOU SEEK HELP? Contact your health care provider if you are unable to remove a tick or if a part of the tick breaks off in the skin. After a tick bite, you need to watch for signs and symptoms of diseases that can be spread by ticks. Contact your health care provider if you develop any of the following:  Fever.  Rash.  Redness and puffiness (swelling) in the area of the tick bite.  Tender, puffy lymph glands.  Watery poop (diarrhea).  Weight loss.  Cough.  Feeling more tired than normal (fatigue).  Muscle, joint, or bone pain.  Belly (abdominal) pain.  Headache.  Change in your level of consciousness.  Trouble walking or moving your legs.  Loss of feeling (numbness) in the legs.  Loss of movement (paralysis).  Shortness of breath.  Confusion.  Throwing up (vomiting) many times. This information is not intended to replace advice given to you by your health care provider. Make sure you discuss any questions you have with your health care provider. Document Released: 11/16/2009 Document Revised: 01/28/2016 Document Reviewed: 01/30/2013 Elsevier Interactive Patient Education  2017 Elsevier Inc.  

## 2017-01-16 NOTE — Progress Notes (Addendum)
Name: Francisco Braun   MRN: [id number]    DOB: 05/24/1951   Date:01/16/2017       Progress Note  Subjective  Chief Complaint  Chief Complaint  Patient presents with  . Insect Bite    tick bite, found 3 on inner right thigh, now with joint pain, swelling and itching    HPI  Pt presents with 3 ticks found 5 days ago. C/o Itching, rash, and joint pain in elbows, hips, and knees, and swollen ankles. Denies fatigue, shortness of breath, headache, vision changes, fevers/chills. He is taking Cellcept for myasthenia gravis - this has been stable.  Patient Active Problem List   Diagnosis Date Noted  . Abnormal glucose 02/24/2016  . Myasthenia gravis ([place]) 05/07/2015  . Allergic reaction to food 04/02/2015  . Post herpetic neuralgia   . S/P hip replacement 11/18/2014  . Primary osteoarthritis of right hip 11/18/2014    Social History  Substance Use Topics  . Smoking status: Former Smoker    Packs/day: 2.00    Years: 5.00    Types: Cigarettes    Quit date: 09/05/1980  . Smokeless tobacco: Never Used  . Alcohol use No     Current Outpatient Prescriptions:  .  Clocortolone Pivalate (CLODERM) 0.1 % cream, Apply 1 application topically 2 (two) times daily., Disp: , Rfl:  .  mycophenolate (CELLCEPT) 500 MG tablet, Take 3 tablets (1,500 mg total) by mouth 2 (two) times daily. (Patient not taking: Reported on 01/16/2017), Disp: 540 tablet, Rfl: 4 .  Probiotic Product (PROBIOTIC PO), Take by mouth daily., Disp: , Rfl:   Allergies  Allergen Reactions  . Niacin Other (See Comments)    Unknown reaction  . Erythromycin     ROS  Constitutional: Negative for fever or weight change.  Respiratory: Negative for cough and shortness of breath.   Cardiovascular: Negative for chest pain or palpitations.  Gastrointestinal: Negative for abdominal pain, no bowel changes.  Musculoskeletal: Negative for gait problem or joint swelling.  Skin: Negative for rash. Positive for three  bites. Neurological: Negative for dizziness or headache.  No other specific complaints in a complete review of systems (except as listed in HPI above).  Objective  Vitals:   01/16/17 1010  BP: 136/70  Pulse: 100  Resp: 18  Temp: 98.2 F (36.8 C)  TempSrc: Oral  SpO2: 97%  Weight: 170 lb 9.6 oz (77.4 kg)  Height: 5\' 9"  (1.753 m)    Body mass index is 25.19 kg/m.  Nursing Note and Vital Signs reviewed.  Physical Exam  Constitutional: Patient appears well-developed and well-nourished. No distress.  HEENT: head atraumatic, normocephalic, pupils equal and reactive to light, EOM's intact, neck supple without lymphadenopathy, oropharynx pink and moist without exudate Cardiovascular: Normal rate, regular rhythm, S1/S2 present.  No murmur or rub heard. No BLE edema. Pulmonary/Chest: Effort normal and breath sounds clear. No respiratory distress or retractions. Abdominal: Soft and non-tender, bowel sounds present x4 quadrants. MSK: Right elbow mild edema, no ecchymosis, slight increase in warmth. No bony tenderness, full AROM of all joints, no nuchal rigidity Skin: Three 0.5cm erythematous areas with small central scab on each. Non-tender, no exudate, no excoriation, no erythema migrans.  Survey of posterior body performed and no other bites or rashes noted. Psychiatric: Patient has a normal mood and affect. behavior is normal. Judgment and thought content normal.  Recent Results (from the past 2160 hour(s))  Comprehensive metabolic panel     Status: None   Collection Time: 12/26/16 10:56  AM  Result Value Ref Range   Glucose 90 65 - 99 mg/dL   BUN 9 8 - 27 mg/dL   Creatinine, Ser 0.88 0.76 - 1.27 mg/dL   GFR calc non Af Amer 91 >59 mL/min/1.73   GFR calc Af Amer 105 >59 mL/min/1.73   BUN/Creatinine Ratio 10 10 - 24   Sodium 141 134 - 144 mmol/L   Potassium 4.7 3.5 - 5.2 mmol/L   Chloride 102 96 - 106 mmol/L   CO2 24 18 - 29 mmol/L   Calcium 9.5 8.6 - 10.2 mg/dL   Total Protein  6.2 6.0 - 8.5 g/dL   Albumin 4.0 3.6 - 4.8 g/dL   Globulin, Total 2.2 1.5 - 4.5 g/dL   Albumin/Globulin Ratio 1.8 1.2 - 2.2   Bilirubin Total 0.2 0.0 - 1.2 mg/dL   Alkaline Phosphatase 89 39 - 117 IU/L   AST 12 0 - 40 IU/L   ALT 9 0 - 44 IU/L  CBC with Differential/Platelet     Status: None   Collection Time: 12/26/16 10:56 AM  Result Value Ref Range   WBC 7.3 3.4 - 10.8 x10E3/uL   RBC 4.90 4.14 - 5.80 x10E6/uL   Hemoglobin 14.7 13.0 - 17.7 g/dL   Hematocrit 43.4 37.5 - 51.0 %   MCV 89 79 - 97 fL   MCH 30.0 26.6 - 33.0 pg   MCHC 33.9 31.5 - 35.7 g/dL   RDW 13.9 12.3 - 15.4 %   Platelets 298 150 - 379 x10E3/uL   Neutrophils 68 Not Estab. %   Lymphs 18 Not Estab. %   Monocytes 11 Not Estab. %   Eos 2 Not Estab. %   Basos 0 Not Estab. %   Neutrophils Absolute 4.9 1.4 - 7.0 x10E3/uL   Lymphocytes Absolute 1.3 0.7 - 3.1 x10E3/uL   Monocytes Absolute 0.8 0.1 - 0.9 x10E3/uL   EOS (ABSOLUTE) 0.1 0.0 - 0.4 x10E3/uL   Basophils Absolute 0.0 0.0 - 0.2 x10E3/uL   Immature Granulocytes 1 Not Estab. %   Immature Grans (Abs) 0.1 0.0 - 0.1 x10E3/uL     Assessment & Plan  1. Tick bite of right thigh, initial encounter - doxycycline (VIBRAMYCIN) 50 MG capsule; Take 2 capsules (100 mg total) by mouth 2 (two) times daily.  Dispense: 112 capsule; Refill: 0  2. Arthralgia of multiple joints - doxycycline (VIBRAMYCIN) 50 MG capsule; Take 2 capsules (100 mg total) by mouth 2 (two) times daily.  Dispense: 112 capsule; Refill: 0  -Discussed probiotic use - patient is already taking probiotic daily and eats yogurt with probiotic daily. -Schedule follow up in 4 weeks with PCP.  -Red flags and when to present for emergency care or RTC including fever >101.54F, chest pain, shortness of breath, new/worsening/un-resolving symptoms, increased joint pain, neck stiffness, weakness or fatigue reviewed with patient at time of visit. Follow up and care instructions discussed and provided in AVS.  - Has DOT  physical performed annually. Declines to schedule annual CPE with fasting labwork  I have reviewed this encounter including the documentation in this note and/or discussed this patient with the Johney Maine, FNP, NP-C. I am certifying that I agree with the content of this note as supervising physician.  Steele Sizer, MD [facility] 01/16/2017, 5:23 PM

## 2017-02-02 ENCOUNTER — Telehealth: Payer: Self-pay | Admitting: Neurology

## 2017-02-02 NOTE — Telephone Encounter (Signed)
Called [facility] to confirm he has refills on file at the pharmacy.  Returned call to patient to let him know too.

## 2017-02-02 NOTE — Telephone Encounter (Signed)
Patient called office requesting refill for mycophenolate (CELLCEPT) 500 MG tablet.  Pharmacy-  [facility]

## 2017-02-10 ENCOUNTER — Ambulatory Visit (INDEPENDENT_AMBULATORY_CARE_PROVIDER_SITE_OTHER): Payer: BLUE CROSS/BLUE SHIELD | Admitting: Family Medicine

## 2017-02-10 ENCOUNTER — Encounter: Payer: Self-pay | Admitting: Family Medicine

## 2017-02-10 VITALS — BP 128/72 | HR 88 | Temp 98.0°F | Resp 16 | Ht 69.0 in | Wt 163.9 lb

## 2017-02-10 DIAGNOSIS — W57XXXA Bitten or stung by nonvenomous insect and other nonvenomous arthropods, initial encounter: Secondary | ICD-10-CM | POA: Diagnosis not present

## 2017-02-10 DIAGNOSIS — R21 Rash and other nonspecific skin eruption: Secondary | ICD-10-CM | POA: Diagnosis not present

## 2017-02-10 DIAGNOSIS — M2550 Pain in unspecified joint: Secondary | ICD-10-CM

## 2017-02-10 NOTE — Patient Instructions (Signed)
Mosquito and tick repellent Let me know of any new symptoms
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
## 2017-03-15 ENCOUNTER — Other Ambulatory Visit: Payer: Self-pay | Admitting: Family Medicine

## 2017-06-28 ENCOUNTER — Encounter: Payer: Self-pay | Admitting: Neurology

## 2017-06-28 ENCOUNTER — Encounter (INDEPENDENT_AMBULATORY_CARE_PROVIDER_SITE_OTHER): Payer: Self-pay

## 2017-06-28 ENCOUNTER — Ambulatory Visit (INDEPENDENT_AMBULATORY_CARE_PROVIDER_SITE_OTHER): Payer: BLUE CROSS/BLUE SHIELD | Admitting: Neurology

## 2017-06-28 VITALS — BP 126/75 | HR 85 | Ht 69.0 in | Wt 171.0 lb

## 2017-06-28 DIAGNOSIS — B0229 Other postherpetic nervous system involvement: Secondary | ICD-10-CM

## 2017-06-28 DIAGNOSIS — G7000 Myasthenia gravis without (acute) exacerbation: Secondary | ICD-10-CM

## 2017-06-28 MED ORDER — MYCOPHENOLATE MOFETIL 500 MG PO TABS: 1500.0000 mg | ORAL_TABLET | Freq: Two times a day (BID) | ORAL | 4 refills | Status: DC
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
## 2017-06-29 ENCOUNTER — Telehealth: Payer: Self-pay | Admitting: *Deleted

## 2017-06-29 LAB — COMPREHENSIVE METABOLIC PANEL
ALBUMIN: 4.1 g/dL (ref 3.6–4.8)
ALK PHOS: 77 IU/L (ref 39–117)
ALT: 12 IU/L (ref 0–44)
AST: 17 IU/L (ref 0–40)
Albumin/Globulin Ratio: 2 (ref 1.2–2.2)
BUN / CREAT RATIO: 10 (ref 10–24)
BUN: 9 mg/dL (ref 8–27)
Bilirubin Total: <0.2 mg/dL (ref 0.0–1.2)
CALCIUM: 9.2 mg/dL (ref 8.6–10.2)
CO2: 21 mmol/L (ref 20–29)
CREATININE: 0.86 mg/dL (ref 0.76–1.27)
Chloride: 105 mmol/L (ref 96–106)
GFR, EST AFRICAN AMERICAN: 106 mL/min/{1.73_m2} (ref >59)
GFR, EST NON AFRICAN AMERICAN: 92 mL/min/{1.73_m2} (ref >59)
GLOBULIN, TOTAL: 2.1 g/dL (ref 1.5–4.5)
GLUCOSE: 83 mg/dL (ref 65–99)
Potassium: 4.5 mmol/L (ref 3.5–5.2)
SODIUM: 144 mmol/L (ref 134–144)
TOTAL PROTEIN: 6.2 g/dL (ref 6.0–8.5)

## 2017-06-29 LAB — CBC WITH DIFFERENTIAL/PLATELET
BASOS ABS: 0 10*3/uL (ref 0.0–0.2)
Basos: 0 %
EOS (ABSOLUTE): 0.2 10*3/uL (ref 0.0–0.4)
Eos: 2 %
HEMOGLOBIN: 14.2 g/dL (ref 13.0–17.7)
Hematocrit: 43.3 % (ref 37.5–51.0)
Immature Grans (Abs): 0 10*3/uL (ref 0.0–0.1)
Immature Granulocytes: 0 %
LYMPHS ABS: 1.1 10*3/uL (ref 0.7–3.1)
Lymphs: 17 %
MCH: 30.5 pg (ref 26.6–33.0)
MCHC: 32.8 g/dL (ref 31.5–35.7)
MCV: 93 fL (ref 79–97)
MONOCYTES: 14 %
MONOS ABS: 0.9 10*3/uL (ref 0.1–0.9)
NEUTROS ABS: 4.6 10*3/uL (ref 1.4–7.0)
Neutrophils: 67 %
Platelets: 244 10*3/uL (ref 150–379)
RBC: 4.65 x10E6/uL (ref 4.14–5.80)
RDW: 14.3 % (ref 12.3–15.4)
WBC: 6.8 10*3/uL (ref 3.4–10.8)

## 2017-06-29 NOTE — Telephone Encounter (Signed)
-----   Message from Marcial Pacas, MD sent at 06/29/2017  1:09 PM EDT ----- Please call patient for normal laboratory result

## 2017-06-29 NOTE — Telephone Encounter (Signed)
Left patient a detailed message, with results, on voicemail (ok per DPR).  Provided our number to call back with any questions.  

## 2017-07-10 NOTE — Telephone Encounter (Signed)
Advised patient of normal lab results per previous message. °

## 2017-08-11 ENCOUNTER — Ambulatory Visit: Payer: BLUE CROSS/BLUE SHIELD | Admitting: Family Medicine

## 2017-08-11 ENCOUNTER — Ambulatory Visit
Admission: RE | Admit: 2017-08-11 | Discharge: 2017-08-11 | Disposition: A | Discharge status: Home / Self Care | Payer: BLUE CROSS/BLUE SHIELD | Source: Ambulatory Visit | Attending: Family Medicine | Admitting: Family Medicine

## 2017-08-11 ENCOUNTER — Encounter: Payer: Self-pay | Admitting: Family Medicine

## 2017-08-11 VITALS — BP 112/76 | HR 112 | Temp 98.2°F | Resp 16 | Wt 174.4 lb

## 2017-08-11 DIAGNOSIS — Z9889 Other specified postprocedural states: Secondary | ICD-10-CM | POA: Diagnosis not present

## 2017-08-11 DIAGNOSIS — J00 Acute nasopharyngitis [common cold]: Secondary | ICD-10-CM | POA: Diagnosis not present

## 2017-08-11 DIAGNOSIS — R058 Other specified cough: Secondary | ICD-10-CM

## 2017-08-11 DIAGNOSIS — R05 Cough: Secondary | ICD-10-CM | POA: Diagnosis not present

## 2017-08-11 MED ORDER — FLUTICASONE PROPIONATE 50 MCG/ACT NA SUSP: 2.0000 | Freq: Every day | NASAL | 0 refills | Status: DC
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
## 2017-12-27 ENCOUNTER — Ambulatory Visit: Payer: BLUE CROSS/BLUE SHIELD | Admitting: Neurology

## 2018-01-08 ENCOUNTER — Encounter: Payer: Self-pay | Admitting: Neurology

## 2018-01-08 ENCOUNTER — Ambulatory Visit: Payer: BLUE CROSS/BLUE SHIELD | Admitting: Neurology

## 2018-01-08 VITALS — BP 120/85 | HR 86 | Ht 69.0 in | Wt 172.0 lb

## 2018-01-08 DIAGNOSIS — G7000 Myasthenia gravis without (acute) exacerbation: Secondary | ICD-10-CM

## 2018-01-08 MED ORDER — PYRIDOSTIGMINE BROMIDE 60 MG PO TABS: 60.0000 mg | ORAL_TABLET | Freq: Three times a day (TID) | ORAL | 6 refills | Status: DC | PRN

## 2018-01-08 MED ORDER — MYCOPHENOLATE MOFETIL 500 MG PO TABS: 1500.0000 mg | ORAL_TABLET | Freq: Two times a day (BID) | ORAL | 4 refills | Status: DC
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
## 2018-01-09 ENCOUNTER — Telehealth: Payer: Self-pay | Admitting: *Deleted

## 2018-01-09 LAB — CBC WITH DIFFERENTIAL/PLATELET
Basophils Absolute: 0 10*3/uL (ref 0.0–0.2)
Basos: 0 %
EOS (ABSOLUTE): 0.1 10*3/uL (ref 0.0–0.4)
Eos: 2 %
Hematocrit: 44.7 % (ref 37.5–51.0)
Hemoglobin: 15.1 g/dL (ref 13.0–17.7)
IMMATURE GRANULOCYTES: 1 %
Immature Grans (Abs): 0 10*3/uL (ref 0.0–0.1)
Lymphocytes Absolute: 1.1 10*3/uL (ref 0.7–3.1)
Lymphs: 14 %
MCH: 30.4 pg (ref 26.6–33.0)
MCHC: 33.8 g/dL (ref 31.5–35.7)
MCV: 90 fL (ref 79–97)
MONOS ABS: 1 10*3/uL — AB (ref 0.1–0.9)
Monocytes: 12 %
Neutrophils Absolute: 5.4 10*3/uL (ref 1.4–7.0)
Neutrophils: 71 %
PLATELETS: 265 10*3/uL (ref 150–379)
RBC: 4.96 x10E6/uL (ref 4.14–5.80)
RDW: 14.4 % (ref 12.3–15.4)
WBC: 7.7 10*3/uL (ref 3.4–10.8)

## 2018-01-09 LAB — COMPREHENSIVE METABOLIC PANEL
ALK PHOS: 89 IU/L (ref 39–117)
ALT: 14 IU/L (ref 0–44)
AST: 15 IU/L (ref 0–40)
Albumin/Globulin Ratio: 2.3 — ABNORMAL HIGH (ref 1.2–2.2)
Albumin: 4.5 g/dL (ref 3.6–4.8)
BILIRUBIN TOTAL: 0.4 mg/dL (ref 0.0–1.2)
BUN/Creatinine Ratio: 13 (ref 10–24)
BUN: 11 mg/dL (ref 8–27)
CALCIUM: 9.3 mg/dL (ref 8.6–10.2)
CHLORIDE: 106 mmol/L (ref 96–106)
CO2: 19 mmol/L — ABNORMAL LOW (ref 20–29)
Creatinine, Ser: 0.88 mg/dL (ref 0.76–1.27)
GFR calc Af Amer: 104 mL/min/{1.73_m2} (ref >59)
GFR calc non Af Amer: 90 mL/min/{1.73_m2} (ref >59)
Globulin, Total: 2 g/dL (ref 1.5–4.5)
Glucose: 93 mg/dL (ref 65–99)
POTASSIUM: 4.4 mmol/L (ref 3.5–5.2)
Sodium: 142 mmol/L (ref 134–144)
Total Protein: 6.5 g/dL (ref 6.0–8.5)

## 2018-01-09 LAB — TSH: TSH: 1.25 u[IU]/mL (ref 0.450–4.500)

## 2018-01-09 NOTE — Telephone Encounter (Signed)
-----   Message from Marcial Pacas, MD sent at 01/09/2018  8:27 AM EDT ----- Please call patient for normal laboratory result,but this is not to replace his yearly check up, make sure that he still follow up with his PCP.

## 2018-01-09 NOTE — Telephone Encounter (Signed)
Left patient a detailed message, with results, on voicemail (ok per DPR).  Also, informed him to continue yearly follow up w/ PCP. Provided our number to call back with any questions.

## 2018-03-12 ENCOUNTER — Telehealth: Payer: Self-pay | Admitting: Family Medicine

## 2018-03-12 NOTE — Telephone Encounter (Signed)
Please ask patient to schedule an appt in the next month I'll refill his medicine

## 2018-03-12 NOTE — Telephone Encounter (Signed)
Spoke with patient @ 11:00 and he stated that he was currently out of town and will call back to schedule the appointment once he look at his schedule.

## 2018-06-14 ENCOUNTER — Other Ambulatory Visit: Payer: Self-pay | Admitting: Family Medicine

## 2018-07-02 ENCOUNTER — Other Ambulatory Visit: Payer: Self-pay | Admitting: Neurology

## 2018-07-11 ENCOUNTER — Ambulatory Visit: Payer: BLUE CROSS/BLUE SHIELD | Admitting: Neurology

## 2018-07-11 ENCOUNTER — Encounter: Payer: Self-pay | Admitting: Neurology

## 2018-07-11 VITALS — BP 132/82 | HR 69 | Ht 69.0 in | Wt 174.0 lb

## 2018-07-11 DIAGNOSIS — G7000 Myasthenia gravis without (acute) exacerbation: Secondary | ICD-10-CM
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
## 2018-07-11 NOTE — Patient Instructions (Signed)
SolirisT (eculizumab),

## 2018-07-12 ENCOUNTER — Telehealth: Payer: Self-pay | Admitting: *Deleted

## 2018-07-12 LAB — COMPREHENSIVE METABOLIC PANEL
A/G RATIO: 2.3 — AB (ref 1.2–2.2)
ALK PHOS: 87 IU/L (ref 39–117)
ALT: 11 IU/L (ref 0–44)
AST: 13 IU/L (ref 0–40)
Albumin: 4.5 g/dL (ref 3.6–4.8)
BILIRUBIN TOTAL: 0.4 mg/dL (ref 0.0–1.2)
BUN / CREAT RATIO: 10 (ref 10–24)
BUN: 8 mg/dL (ref 8–27)
CHLORIDE: 105 mmol/L (ref 96–106)
CO2: 19 mmol/L — ABNORMAL LOW (ref 20–29)
Calcium: 9.2 mg/dL (ref 8.6–10.2)
Creatinine, Ser: 0.8 mg/dL (ref 0.76–1.27)
GFR calc Af Amer: 108 mL/min/{1.73_m2} (ref >59)
GFR calc non Af Amer: 94 mL/min/{1.73_m2} (ref >59)
GLOBULIN, TOTAL: 2 g/dL (ref 1.5–4.5)
Glucose: 82 mg/dL (ref 65–99)
POTASSIUM: 4.3 mmol/L (ref 3.5–5.2)
Sodium: 143 mmol/L (ref 134–144)
Total Protein: 6.5 g/dL (ref 6.0–8.5)

## 2018-07-12 LAB — CBC WITH DIFFERENTIAL/PLATELET
Basophils Absolute: 0 10*3/uL (ref 0.0–0.2)
Basos: 0 %
EOS (ABSOLUTE): 0.1 10*3/uL (ref 0.0–0.4)
EOS: 2 %
HEMATOCRIT: 45.4 % (ref 37.5–51.0)
Hemoglobin: 14.7 g/dL (ref 13.0–17.7)
Immature Grans (Abs): 0 10*3/uL (ref 0.0–0.1)
Immature Granulocytes: 0 %
LYMPHS ABS: 1.1 10*3/uL (ref 0.7–3.1)
Lymphs: 16 %
MCH: 29.2 pg (ref 26.6–33.0)
MCHC: 32.4 g/dL (ref 31.5–35.7)
MCV: 90 fL (ref 79–97)
MONOS ABS: 0.9 10*3/uL (ref 0.1–0.9)
Monocytes: 13 %
NEUTROS ABS: 4.9 10*3/uL (ref 1.4–7.0)
Neutrophils: 69 %
Platelets: 279 10*3/uL (ref 150–450)
RBC: 5.03 x10E6/uL (ref 4.14–5.80)
RDW: 13 % (ref 12.3–15.4)
WBC: 7 10*3/uL (ref 3.4–10.8)

## 2018-07-12 LAB — TSH: TSH: 1.39 u[IU]/mL (ref 0.450–4.500)

## 2018-07-12 LAB — PSA: Prostate Specific Ag, Serum: 3.6 ng/mL (ref 0.0–4.0)

## 2018-07-12 NOTE — Telephone Encounter (Signed)
Left patient a detailed message, with results, on voicemail (ok per DPR).  Provided our number to call back with any questions.  

## 2018-07-12 NOTE — Telephone Encounter (Signed)
-----   Message from Marcial Pacas, MD sent at 07/12/2018  8:17 AM EST ----- Please call patient for normal laboratory result

## 2018-07-13 NOTE — Telephone Encounter (Signed)
Pt requesting a call, stating he has a few follow up question with RN. Please advise

## 2018-07-13 NOTE — Telephone Encounter (Signed)
Called, LVM for pt returning his call 

## 2018-07-16 NOTE — Telephone Encounter (Signed)
I called pt back. He wanted to know values for PSA and TSH. I relayed results. He will pick up a copy of results from office this afternoon to bring to urology appt. Placed results up front with record release for him to sign.

## 2018-07-16 NOTE — Telephone Encounter (Signed)
Pt returning RN's call.

## 2018-09-03 ENCOUNTER — Encounter: Payer: Self-pay | Admitting: Nurse Practitioner

## 2018-09-03 ENCOUNTER — Ambulatory Visit: Payer: BLUE CROSS/BLUE SHIELD | Admitting: Nurse Practitioner

## 2018-09-03 VITALS — BP 108/62 | HR 99 | Temp 98.8°F | Ht 72.0 in | Wt 172.8 lb

## 2018-09-03 DIAGNOSIS — R059 Cough, unspecified: Secondary | ICD-10-CM

## 2018-09-03 DIAGNOSIS — R509 Fever, unspecified: Secondary | ICD-10-CM | POA: Diagnosis not present

## 2018-09-03 DIAGNOSIS — R05 Cough: Secondary | ICD-10-CM | POA: Diagnosis not present

## 2018-09-03 DIAGNOSIS — R52 Pain, unspecified: Secondary | ICD-10-CM

## 2018-09-03 LAB — POCT INFLUENZA A/B
Influenza A, POC: NEGATIVE
Influenza B, POC: NEGATIVE

## 2018-09-03 MED ORDER — BENZONATATE 100 MG PO CAPS: 100.0000 mg | ORAL_CAPSULE | Freq: Three times a day (TID) | ORAL | 0 refills | Status: DC | PRN
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
## 2018-09-03 NOTE — Patient Instructions (Addendum)
-   Please take acetaminophen 500-1000mg  every 8 hours to keep body ache and fever manageable. (No more than 3,000mg  daily)  - Please take tessalon perls every 8 hours for cough - If symptoms are not improving in the next 2-3 day please call us and let us know; or at any point if symptoms are worsening or you develop shortness of breath, chest pain please get immediate medical attention.

## 2018-09-06 ENCOUNTER — Ambulatory Visit
Admission: RE | Admit: 2018-09-06 | Discharge: 2018-09-06 | Disposition: A | Discharge status: Home / Self Care | Payer: BLUE CROSS/BLUE SHIELD | Attending: Family Medicine | Admitting: Family Medicine

## 2018-09-06 ENCOUNTER — Encounter: Payer: Self-pay | Admitting: Family Medicine

## 2018-09-06 ENCOUNTER — Ambulatory Visit
Admission: RE | Admit: 2018-09-06 | Discharge: 2018-09-06 | Disposition: A | Discharge status: Home / Self Care | Payer: BLUE CROSS/BLUE SHIELD | Source: Ambulatory Visit | Attending: Family Medicine | Admitting: Family Medicine

## 2018-09-06 ENCOUNTER — Ambulatory Visit: Payer: BLUE CROSS/BLUE SHIELD | Admitting: Family Medicine

## 2018-09-06 VITALS — BP 110/64 | HR 99 | Temp 98.6°F | Ht 72.0 in | Wt 171.3 lb

## 2018-09-06 DIAGNOSIS — R059 Cough, unspecified: Secondary | ICD-10-CM

## 2018-09-06 DIAGNOSIS — R05 Cough: Secondary | ICD-10-CM | POA: Insufficient documentation

## 2018-09-06 DIAGNOSIS — R0981 Nasal congestion: Secondary | ICD-10-CM | POA: Diagnosis not present

## 2018-09-06 DIAGNOSIS — R509 Fever, unspecified: Secondary | ICD-10-CM | POA: Diagnosis not present

## 2018-09-06 MED ORDER — DOXYCYCLINE HYCLATE 100 MG PO TABS: 100.0000 mg | ORAL_TABLET | Freq: Two times a day (BID) | ORAL | 0 refills | Status: DC
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
## 2018-09-06 NOTE — Patient Instructions (Addendum)
  I do recommend yearly flu shots; for individuals who don't want flu shots, try to practice excellent hand hygiene, and avoid nursing homes, day cares, and hospitals during peak flu season; taking additional vitamin C daily during flu/cold season may help boost your immune system too BUT talk to Dr. Krista Blue as to whether or not that is recommended for you individually  Start the antibiotics Please do eat yogurt or kimchi or take a probiotic daily for the next month We want to replace the healthy germs in the gut If you notice foul, watery diarrhea in the next two months, schedule an appointment RIGHT AWAY or go to an urgent care or the emergency room if a holiday or over a weekend

## 2018-09-07 ENCOUNTER — Encounter: Payer: Self-pay | Admitting: Family Medicine

## 2018-09-07 DIAGNOSIS — I700 Atherosclerosis of aorta: Secondary | ICD-10-CM | POA: Insufficient documentation

## 2018-09-24 ENCOUNTER — Telehealth: Payer: Self-pay | Admitting: Neurology

## 2018-09-24 NOTE — Telephone Encounter (Signed)
Returned call to the patient.  He is scheduled to have a prostate lift.  His urologist would like him to take an antibiotic.  He has a pre-procedure appt visit with him today.  He is going to come by to pick up the information below (provided by Dr. Krista Blue) and take it to Dr. Yves Dill.  He may would like Dr. Krista Blue to speak directly to Dr. Yves Dill.  Dr. Krista Blue stated she will be glad to do this for him.  The patient's appt is today at 1pm.  If Dr. Yves Dill or the patient calls back, please interrupt one of [place] to take the call.

## 2018-09-24 NOTE — Telephone Encounter (Signed)
Pt states Dr. Quay Burow office told him to start taking Levofloxacin 500mg  today before his surgery tomorrow 09/24/18 but the pharmacy told him not to take it because it could possibly kill him and to call Dr Krista Blue for advise. Pt would like to be called before noon since he has to be at Dr. Shelby Mattocks office at 1pm. Please advise.

## 2018-09-24 NOTE — Telephone Encounter (Signed)
Please call patient, certain medications, such as levofloxacin could exacerbate MG symptoms.   Cautionary Drugs Certain medications and over the counter preparations may cause worsening of MG symptoms. Remember to tell any doctor or dentist about your MG diagnosis. It is important to check with your doctor before starting any new medication including over the counter medications or preparations.   To download a handout on Cautionary Drugs for MG, please see here.   Drugs to avoid or use with caution in MG*   Many different drugs have been associated with worsening myasthenia gravis (MG). However, these drug associations do not necessarily mean that a patient with MG should not be prescribed these medications. In many instances, reports of worsening MG are very rare.  In some instances, there may only be a "chance" association (i.e. not causal).   In addition, some of these drugs may be necessary for a patient's treatment and should not be deemed "off limits". It is advisable that patients and physicians recognize and discuss the possibility that a particular drug might worsen the patient's MG. They should also consider, when appropriate, the pros and cons of an alternate treatment, if available.   It is important that the patient notify his or her physicians if the symptoms of MG worsen after starting any new medication. Only the more common prescription drugs with the strongest evidence suggesting an association with worsening MG are provided in the list below. Telithromycin: antibiotic for community acquired pneumonia. The [place] FDA has designated a "black box" warning for this drug in MG. Should not be used in MG.  Fluoroquinolones (e.g., ciprofloxacin, moxifloxacin and levofloxacin): commonly prescribed broadspectrum antibiotics that are associated with worsening MG. The [place] FDA has designated a "black box" warning for these agents in MG. Use cautiously, if at all.  Botulinum toxin: avoid.   D-penicillamine: used for Wilson disease and rarely for rheumatoid arthritis. Strongly associated with causing MG. Avoid  Quinine: occasionally used for leg cramps. Use prohibited except in malaria in [place].  Magnesium: potentially dangerous if given intravenously, i.e. for eclampsia during late pregnancy or for hypomagnesemia. Use only if absolutelynecessary and observe for worsening.  Macrolide antibiotics (e.g., erythromycin, azithromycin, clarithromycin): commonly prescribed antibiotics for gram-positive bacterial infections. May worsen MG. Use cautiously, if at all.  Aminoglycoside antibiotics (e.g., gentamycin, neomycin, tobramycin):used for gram-negative bacterial infections.  May worsen MG. Use cautiously if no alternative treatment available.  Corticosteroids: A standard treatment for MG, but may cause transient worsening within the first two weeks. Monitor carefully for this possibility (see Table 1).  Procainamide:  used for irregular heart rhythm. May worsen MG. Use with caution.  Desferrioxamine: Chelating agent used for hemochromatosis. May worsen MG. Beta-blockers: commonly prescribed for hypertension, heart disease and migraine but potentially dangerous in MG. May worsen MG. Use cautiously.   Statins (e.g., atorvastatin, pravastatin, rosuvastatin, simvastatin): used to reduce serum cholesterol. May worsen or precipitate MG.  Use cautiously if indicated and at lowest dose needed.    Iodinated radiologic contrast agents: older reports document increased MG weakness, but modern contrast agents appear safer.  Use cautiously and observe for worsening.

## 2018-12-20 ENCOUNTER — Telehealth: Payer: Self-pay | Admitting: *Deleted

## 2018-12-20 NOTE — Telephone Encounter (Signed)
I left patient a message letting him know that we need to change his appt that was scheduled on 01/23/2019. Provided our number to call back.  When patient calls back, please provide him with a new appt date and time for a follow up. This will need to be a virtual visit in May or an in-office visit towards mid-June.   If he opts for the virtual visit, he will need the process explained to him and we will need his email address.

## 2019-01-11 ENCOUNTER — Telehealth: Payer: Self-pay | Admitting: Family Medicine

## 2019-01-11 NOTE — Telephone Encounter (Signed)
Left detailed voicemail

## 2019-01-11 NOTE — Telephone Encounter (Signed)
Agree with [facility] RN note - we are not currently recommending testing as it is too early right now to know which test is most appropriate and most reliable.  This may change in the near future, but for now, no antibody testing by [facility].

## 2019-01-11 NOTE — Telephone Encounter (Signed)
Rec'd a voice message on the COVID 19 question line inquiring about getting an Antibody Test for COVID 19.  Stated in voice message that he had been sick about 17 days in January, and is requesting to be tested, to see if he may have had the Coronavirus.  Attempted to return call to the pt.  Left vm that currently [facility] is not doing the Antibody Testing for Coronavirus.  Advised that a message will be sent to PCP office, to make Dr. Sanda Klein aware of his concern and request to be tested.  Advised to call back if any further questions.

## 2019-01-23 ENCOUNTER — Ambulatory Visit: Payer: BLUE CROSS/BLUE SHIELD | Admitting: Neurology

## 2019-02-04 ENCOUNTER — Ambulatory Visit (INDEPENDENT_AMBULATORY_CARE_PROVIDER_SITE_OTHER): Payer: BLUE CROSS/BLUE SHIELD | Admitting: Neurology

## 2019-02-04 ENCOUNTER — Encounter: Payer: Self-pay | Admitting: Neurology

## 2019-02-04 ENCOUNTER — Other Ambulatory Visit: Payer: Self-pay

## 2019-02-04 DIAGNOSIS — G7000 Myasthenia gravis without (acute) exacerbation: Secondary | ICD-10-CM | POA: Diagnosis not present

## 2019-02-04 MED ORDER — MYCOPHENOLATE MOFETIL 500 MG PO TABS: 1000.0000 mg | ORAL_TABLET | Freq: Two times a day (BID) | ORAL | 4 refills | Status: DC
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
## 2019-02-11 ENCOUNTER — Other Ambulatory Visit: Payer: Self-pay | Admitting: Neurology

## 2019-04-15 ENCOUNTER — Telehealth: Payer: Self-pay | Admitting: Neurology

## 2019-04-15 NOTE — Telephone Encounter (Signed)
I have spoken with the patient.  He needs a letter stating he has Myasthenia Gravis and his breathing may be compromised.

## 2019-04-15 NOTE — Telephone Encounter (Signed)
Patient called stating he needs to speak with Sharyn Lull today he would not tell me what it was but he said it was very important and needed a call today.

## 2019-04-16 ENCOUNTER — Encounter: Payer: Self-pay | Admitting: *Deleted

## 2019-04-16 NOTE — Telephone Encounter (Signed)
Letter written, signed by Dr. Krista Blue and placed up front for the patient.  He plans to pick it up on 04/17/2019.

## 2019-06-28 ENCOUNTER — Other Ambulatory Visit: Payer: Self-pay | Admitting: Neurology

## 2020-02-08 ENCOUNTER — Other Ambulatory Visit: Payer: Self-pay | Admitting: Neurology

## 2020-05-07 ENCOUNTER — Other Ambulatory Visit: Payer: Self-pay | Admitting: Neurology

## 2020-06-07 ENCOUNTER — Other Ambulatory Visit: Payer: Self-pay | Admitting: Neurology

## 2020-07-12 ENCOUNTER — Other Ambulatory Visit: Payer: Self-pay | Admitting: Neurology

## 2020-07-16 ENCOUNTER — Other Ambulatory Visit (HOSPITAL_COMMUNITY): Payer: Self-pay | Admitting: Urology

## 2020-07-16 ENCOUNTER — Other Ambulatory Visit: Payer: Self-pay | Admitting: Urology

## 2020-07-16 DIAGNOSIS — R319 Hematuria, unspecified: Secondary | ICD-10-CM

## 2020-07-31 ENCOUNTER — Other Ambulatory Visit: Payer: Self-pay

## 2020-07-31 ENCOUNTER — Ambulatory Visit
Admission: RE | Admit: 2020-07-31 | Discharge: 2020-07-31 | Disposition: A | Discharge status: Home / Self Care | Payer: BC Managed Care – PPO | Source: Ambulatory Visit | Attending: Urology | Admitting: Urology

## 2020-07-31 DIAGNOSIS — R319 Hematuria, unspecified: Secondary | ICD-10-CM | POA: Diagnosis not present

## 2020-07-31 LAB — POCT I-STAT CREATININE: Creatinine, Ser: 0.9 mg/dL (ref 0.61–1.24)

## 2020-07-31 MED ORDER — IOHEXOL 300 MG/ML  SOLN
125.0000 mL | Freq: Once | INTRAMUSCULAR | Status: AC | PRN
  Administered 2020-07-31: 125 mL via INTRAVENOUS

## 2020-08-07 NOTE — H&P (Signed)
NAME: Francisco Braun, BERNASCONI MEDICAL RECORD EX:[id number] ACCOUNT [id number] DATE OF BIRTH:[birth year] FACILITY: [facility] LOCATION: [facility] PHYSICIAN:Donique Hammonds Farrel Conners, MD  HISTORY AND PHYSICAL  DATE OF ADMISSION:  08/13/2020  CHIEF COMPLAINT:  Pelvic pain.  HISTORY OF PRESENT ILLNESS:  The patient is a 69 year old white male who presented to the office with dysuria and right pelvic pain radiating to his right testicle.  The pain is relieved while he is sitting down.  Urinalysis revealed microscopic  hematuria.  Urine culture was negative.  However, pelvic CT scan indicated that he had a 1.2 cm stone within the bladder adjacent to the right UVJ.  He had no hydronephrosis or renal stones.  He does have a past history of kidney stones.  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  He comes in now for cystoscopy with litholapaxy using holmium laser.  PAST MEDICAL HISTORY:  ALLERGIES:  No drug allergies.  CURRENT MEDICATIONS:  Included dutasteride, CellCept, vitamin C, vitamin D3, magnesium, zinc, and selenium.  PAST SURGICAL HISTORY: 1.  Repair of hiatal hernia in 2008. 2.  Repair of recurrent hiatal hernia in 2014. 3.  Right hip replacement in 2013. 4.  Right lung lobectomy in 1981. 5.  UroLift procedure in 2020.  PAST AND CURRENT MEDICAL CONDITIONS: 1.  Myasthenia gravis. 2.  History of superficial skin cancer. 3.  Kidney stones. 4.  BPH.  REVIEW OF SYSTEMS:  The patient denies chest pain, shortness of breath, diabetes, stroke, or hypertension.  SOCIAL HISTORY:  The patient denied tobacco use.  He quit smoking in 1985 with a 8-pack-year history.  He denied alcohol use.  FAMILY HISTORY:  Father died at age 66 of a stroke.  Mother died of lung cancer at age 22.  There is no family history of urological disease or urological cancer.  PHYSICAL EXAMINATION: VITAL SIGNS:  Height 5 feet 9 inches, weight 174 pounds, BMI 26. GENERAL:   Well-nourished, white male in no acute distress. HEENT:  Sclerae were clear. NECK:  No palpable thyroid nodules.  No audible carotid bruits. PULMONARY:  Lungs clear to auscultation. LYMPHATIC:  No palpable cervical or inguinal adenopathy. CARDIOVASCULAR:  Regular rhythm and rate without audible murmurs. ABDOMEN:  Soft, nontender abdomen.  No CVA tenderness. GENITOURINARY:  Circumcised.  Testes smooth, nontender, approximately 20 mL size each. RECTAL:  15 g, smooth, nontender prostate. NEUROMUSCULAR:  Alert and oriented x3.  IMPRESSION:  Bladder stone.  PLAN:  Cystolitholapaxy with holmium laser.  IN/NUANCE  D:08/06/2020 T:08/06/2020 JOB:[id number]

## 2020-08-10 ENCOUNTER — Other Ambulatory Visit
Admission: RE | Admit: 2020-08-10 | Discharge: 2020-08-10 | Disposition: A | Discharge status: Home / Self Care | Payer: BC Managed Care – PPO | Source: Ambulatory Visit | Attending: Urology | Admitting: Urology

## 2020-08-10 DIAGNOSIS — Z20822 Contact with and (suspected) exposure to covid-19: Secondary | ICD-10-CM | POA: Insufficient documentation

## 2020-08-10 DIAGNOSIS — Z01812 Encounter for preprocedural laboratory examination: Secondary | ICD-10-CM | POA: Insufficient documentation

## 2020-08-10 HISTORY — DX: Personal history of urinary calculi: Z87.442

## 2020-08-10 HISTORY — DX: Malignant (primary) neoplasm, unspecified: C80.1

## 2020-08-10 NOTE — Patient Instructions (Addendum)
Your procedure is scheduled on: 08/13/20- Thurday Report to the Registration Desk on the 1st floor of the [facility]. To find out your arrival time, please call [phone] between 1PM - 3PM on: 08/12/20- Wednesday  REMEMBER: Instructions that are not followed completely may result in serious medical risk, up to and including death; or upon the discretion of your surgeon and anesthesiologist your surgery may need to be rescheduled.  Do not eat food after midnight the night before surgery.  No gum chewing, lozengers or hard candies.  You may however, drink CLEAR liquids up to 2 hours before you are scheduled to arrive for your surgery. Do not drink anything within 2 hours of your scheduled arrival time.  Clear liquids include: - water  - apple juice without pulp - gatorade (not RED, PURPLE, OR BLUE) - black coffee or tea (Do NOT add milk or creamers to the coffee or tea) Do NOT drink anything that is not on this list.  Type 1 and Type 2 diabetics should only drink water.   TAKE THESE MEDICATIONS THE MORNING OF SURGERY WITH A SIP OF WATER: - mycophenolate (CELLCEPT) 500 MG tablet  One week prior to surgery: Stop Anti-inflammatories (NSAIDS) such as Advil, Aleve, Ibuprofen, Motrin, Naproxen, Naprosyn and Aspirin based products such as Excedrin, Goodys Powder, BC Powder.  Stop ANY OVER THE COUNTER supplements until after surgery. Stop Zinc, Vitamin C, and Magnesium Gluconate today 08/10/20, may resume after surgery. (However, you may continue taking Vitamin D, Vitamin B, and multivitamin up until the day before surgery.)  No Alcohol for 24 hours before or after surgery.  No Smoking including e-cigarettes for 24 hours prior to surgery.  No chewable tobacco products for at least 6 hours prior to surgery.  No nicotine patches on the day of surgery.  Do not use any "recreational" drugs for at least a week prior to your surgery.  Please be advised that the combination of cocaine and  anesthesia may have negative outcomes, up to and including death. If you test positive for cocaine, your surgery will be cancelled.  On the morning of surgery brush your teeth with toothpaste and water, you may rinse your mouth with mouthwash if you wish. Do not swallow any toothpaste or mouthwash.  Do not wear jewelry, make-up, hairpins, clips or nail polish.  Do not wear lotions, powders, or perfumes.   Do not shave body from the neck down 48 hours prior to surgery just in case you cut yourself which could leave a site for infection.  Also, freshly shaved skin may become irritated if using the CHG soap.  Contact lenses, hearing aids and dentures may not be worn into surgery.  Do not bring valuables to the hospital. [facility] is not responsible for any missing/lost belongings or valuables.   Notify your doctor if there is any change in your medical condition (cold, fever, infection).  Wear comfortable clothing (specific to your surgery type) to the hospital.  Plan for stool softeners for home use; pain medications have a tendency to cause constipation. You can also help prevent constipation by eating foods high in fiber such as fruits and vegetables and drinking plenty of fluids as your diet allows.  After surgery, you can help prevent lung complications by doing breathing exercises.  Take deep breaths and cough every 1-2 hours. Your doctor may order a device called an Incentive Spirometer to help you take deep breaths. When coughing or sneezing, hold a pillow firmly against your incision  with both hands. This is called "splinting." Doing this helps protect your incision. It also decreases belly discomfort.  If you are being admitted to the hospital overnight, leave your suitcase in the car. After surgery it may be brought to your room.  If you are being discharged the day of surgery, you will not be allowed to drive home. You will need a responsible adult (18 years or older) to  drive you home and stay with you that night.   If you are taking public transportation, you will need to have a responsible adult (18 years or older) with you. Please confirm with your physician that it is acceptable to use public transportation.   Please call the [facility] at [phone] if you have any questions about these instructions.  Visitation Policy:  Patients undergoing a surgery or procedure may have one family member or support person with them as long as that person is not COVID-19 positive or experiencing its symptoms.  That person may remain in the waiting area during the procedure.  Inpatient Visitation Update:   In an effort to ensure the safety of our team members and our patients, we are implementing a change to our visitation policy:  Effective Monday, Aug. 9, at 7 a.m., inpatients will be allowed one support person.  o The support person may change daily.  o The support person must pass our screening, gel in and out, and wear a mask at all times, including in the patient's room.  o Patients must also wear a mask when staff or their support person are in the room.  o Masking is required regardless of vaccination status.  Systemwide, no visitors 17 or younger.

## 2020-08-11 ENCOUNTER — Other Ambulatory Visit: Payer: Self-pay

## 2020-08-11 ENCOUNTER — Encounter
Admission: RE | Admit: 2020-08-11 | Discharge: 2020-08-11 | Disposition: A | Discharge status: Home / Self Care | Payer: BC Managed Care – PPO | Source: Ambulatory Visit | Attending: Urology | Admitting: Urology

## 2020-08-11 DIAGNOSIS — Z902 Acquired absence of lung [part of]: Secondary | ICD-10-CM | POA: Diagnosis not present

## 2020-08-11 DIAGNOSIS — Z87891 Personal history of nicotine dependence: Secondary | ICD-10-CM | POA: Diagnosis not present

## 2020-08-11 DIAGNOSIS — X58XXXA Exposure to other specified factors, initial encounter: Secondary | ICD-10-CM | POA: Diagnosis not present

## 2020-08-11 DIAGNOSIS — Z96641 Presence of right artificial hip joint: Secondary | ICD-10-CM | POA: Diagnosis not present

## 2020-08-11 DIAGNOSIS — Z20822 Contact with and (suspected) exposure to covid-19: Secondary | ICD-10-CM | POA: Diagnosis not present

## 2020-08-11 DIAGNOSIS — T191XXA Foreign body in bladder, initial encounter: Secondary | ICD-10-CM | POA: Diagnosis not present

## 2020-08-11 DIAGNOSIS — Z79899 Other long term (current) drug therapy: Secondary | ICD-10-CM | POA: Diagnosis not present

## 2020-08-11 DIAGNOSIS — N210 Calculus in bladder: Secondary | ICD-10-CM | POA: Diagnosis present

## 2020-08-11 DIAGNOSIS — Z87442 Personal history of urinary calculi: Secondary | ICD-10-CM | POA: Diagnosis not present

## 2020-08-11 DIAGNOSIS — Z85828 Personal history of other malignant neoplasm of skin: Secondary | ICD-10-CM | POA: Diagnosis not present

## 2020-08-11 LAB — SARS CORONAVIRUS 2 (TAT 6-24 HRS): SARS Coronavirus 2: NEGATIVE

## 2020-08-13 ENCOUNTER — Other Ambulatory Visit: Payer: Self-pay

## 2020-08-13 ENCOUNTER — Encounter
Admission: RE | Disposition: A | Discharge status: Home / Self Care | Payer: Self-pay | Source: Home / Self Care | Attending: Urology

## 2020-08-13 ENCOUNTER — Encounter: Payer: Self-pay | Admitting: Urology

## 2020-08-13 ENCOUNTER — Ambulatory Visit: Payer: BC Managed Care – PPO | Admitting: Certified Registered"

## 2020-08-13 ENCOUNTER — Ambulatory Visit
Admission: RE | Admit: 2020-08-13 | Discharge: 2020-08-13 | Disposition: A | Discharge status: Home / Self Care | Payer: BC Managed Care – PPO | Attending: Urology | Admitting: Urology

## 2020-08-13 DIAGNOSIS — T191XXA Foreign body in bladder, initial encounter: Secondary | ICD-10-CM | POA: Insufficient documentation

## 2020-08-13 DIAGNOSIS — Z902 Acquired absence of lung [part of]: Secondary | ICD-10-CM | POA: Insufficient documentation

## 2020-08-13 DIAGNOSIS — Z96641 Presence of right artificial hip joint: Secondary | ICD-10-CM | POA: Insufficient documentation

## 2020-08-13 DIAGNOSIS — X58XXXA Exposure to other specified factors, initial encounter: Secondary | ICD-10-CM | POA: Insufficient documentation

## 2020-08-13 DIAGNOSIS — N210 Calculus in bladder: Secondary | ICD-10-CM | POA: Insufficient documentation

## 2020-08-13 DIAGNOSIS — Z20822 Contact with and (suspected) exposure to covid-19: Secondary | ICD-10-CM | POA: Insufficient documentation

## 2020-08-13 DIAGNOSIS — Z87891 Personal history of nicotine dependence: Secondary | ICD-10-CM | POA: Insufficient documentation

## 2020-08-13 DIAGNOSIS — Z87442 Personal history of urinary calculi: Secondary | ICD-10-CM | POA: Insufficient documentation

## 2020-08-13 DIAGNOSIS — Z85828 Personal history of other malignant neoplasm of skin: Secondary | ICD-10-CM | POA: Insufficient documentation

## 2020-08-13 DIAGNOSIS — Z79899 Other long term (current) drug therapy: Secondary | ICD-10-CM | POA: Insufficient documentation

## 2020-08-13 HISTORY — PX: CYSTOSCOPY WITH LITHOLAPAXY: SHX1425

## 2020-08-13 SURGERY — CYSTOSCOPY, WITH BLADDER CALCULUS LITHOLAPAXY
Anesthesia: General

## 2020-08-13 MED ORDER — FENTANYL CITRATE (PF) 100 MCG/2ML IJ SOLN
INTRAMUSCULAR | Status: DC | PRN
  Administered 2020-08-13 (×2): 50 ug via INTRAVENOUS

## 2020-08-13 MED ORDER — URIBEL 118 MG PO CAPS
1.0000 | ORAL_CAPSULE | Freq: Four times a day (QID) | ORAL | 3 refills | Status: DC | PRN
Start: 2020-08-13 — End: 2021-02-24

## 2020-08-13 MED ORDER — CHLORHEXIDINE GLUCONATE 0.12 % MT SOLN
OROMUCOSAL | Status: AC
  Filled 2020-08-13: qty 15

## 2020-08-13 MED ORDER — TAMSULOSIN HCL 0.4 MG PO CAPS
0.4000 mg | ORAL_CAPSULE | Freq: Every day | ORAL | 1 refills | Status: AC
Start: 2020-08-13 — End: ?

## 2020-08-13 MED ORDER — FAMOTIDINE 20 MG PO TABS
ORAL_TABLET | ORAL | Status: AC
  Filled 2020-08-13: qty 1

## 2020-08-13 MED ORDER — CEFAZOLIN SODIUM-DEXTROSE 2-4 GM/100ML-% IV SOLN
2.0000 g | Freq: Once | INTRAVENOUS | Status: AC
  Administered 2020-08-13: 2 g via INTRAVENOUS

## 2020-08-13 MED ORDER — ONDANSETRON HCL 4 MG/2ML IJ SOLN
INTRAMUSCULAR | Status: DC | PRN
  Administered 2020-08-13: 4 mg via INTRAVENOUS

## 2020-08-13 MED ORDER — FENTANYL CITRATE (PF) 100 MCG/2ML IJ SOLN: 25.0000 ug | INTRAMUSCULAR | Status: DC | PRN

## 2020-08-13 MED ORDER — LIDOCAINE HCL URETHRAL/MUCOSAL 2 % EX GEL
CUTANEOUS | Status: DC | PRN
  Administered 2020-08-13: 1

## 2020-08-13 MED ORDER — DEXAMETHASONE SODIUM PHOSPHATE 10 MG/ML IJ SOLN
INTRAMUSCULAR | Status: DC | PRN
  Administered 2020-08-13: 10 mg via INTRAVENOUS

## 2020-08-13 MED ORDER — FAMOTIDINE 20 MG PO TABS
20.0000 mg | ORAL_TABLET | Freq: Once | ORAL | Status: AC
  Administered 2020-08-13: 20 mg via ORAL

## 2020-08-13 MED ORDER — KETOROLAC TROMETHAMINE 30 MG/ML IJ SOLN
INTRAMUSCULAR | Status: DC | PRN
  Administered 2020-08-13: 30 mg via INTRAVENOUS

## 2020-08-13 MED ORDER — EPHEDRINE SULFATE 50 MG/ML IJ SOLN
INTRAMUSCULAR | Status: DC | PRN
  Administered 2020-08-13: 20 mg via INTRAVENOUS

## 2020-08-13 MED ORDER — LIDOCAINE HCL URETHRAL/MUCOSAL 2 % EX GEL
CUTANEOUS | Status: AC
  Filled 2020-08-13: qty 10

## 2020-08-13 MED ORDER — PROPOFOL 10 MG/ML IV BOLUS
INTRAVENOUS | Status: DC | PRN
  Administered 2020-08-13: 150 mg via INTRAVENOUS

## 2020-08-13 MED ORDER — ACETAMINOPHEN 10 MG/ML IV SOLN
INTRAVENOUS | Status: AC
  Filled 2020-08-13: qty 100

## 2020-08-13 MED ORDER — ONDANSETRON HCL 4 MG/2ML IJ SOLN: 4.0000 mg | Freq: Once | INTRAMUSCULAR | Status: DC | PRN

## 2020-08-13 MED ORDER — ORAL CARE MOUTH RINSE: 15.0000 mL | Freq: Once | OROMUCOSAL | Status: AC

## 2020-08-13 MED ORDER — EPHEDRINE 5 MG/ML INJ
INTRAVENOUS | Status: AC
  Filled 2020-08-13: qty 10

## 2020-08-13 MED ORDER — CHLORHEXIDINE GLUCONATE 0.12 % MT SOLN
15.0000 mL | Freq: Once | OROMUCOSAL | Status: AC
  Administered 2020-08-13: 15 mL via OROMUCOSAL

## 2020-08-13 MED ORDER — ACETAMINOPHEN-CODEINE #3 300-30 MG PO TABS
1.0000 | ORAL_TABLET | ORAL | 1 refills | Status: DC | PRN
Start: 2020-08-13 — End: 2021-02-24

## 2020-08-13 MED ORDER — PROPOFOL 10 MG/ML IV BOLUS
INTRAVENOUS | Status: AC
  Filled 2020-08-13: qty 20

## 2020-08-13 MED ORDER — LIDOCAINE HCL (PF) 2 % IJ SOLN
INTRAMUSCULAR | Status: AC
  Filled 2020-08-13: qty 5

## 2020-08-13 MED ORDER — CEFAZOLIN SODIUM-DEXTROSE 2-4 GM/100ML-% IV SOLN
INTRAVENOUS | Status: AC
  Filled 2020-08-13: qty 100

## 2020-08-13 MED ORDER — FENTANYL CITRATE (PF) 100 MCG/2ML IJ SOLN
INTRAMUSCULAR | Status: AC
  Filled 2020-08-13: qty 2

## 2020-08-13 MED ORDER — DEXAMETHASONE SODIUM PHOSPHATE 10 MG/ML IJ SOLN
INTRAMUSCULAR | Status: AC
  Filled 2020-08-13: qty 1

## 2020-08-13 MED ORDER — ACETAMINOPHEN 10 MG/ML IV SOLN
INTRAVENOUS | Status: DC | PRN
  Administered 2020-08-13: 1000 mg via INTRAVENOUS

## 2020-08-13 MED ORDER — LIDOCAINE HCL (CARDIAC) PF 100 MG/5ML IV SOSY
PREFILLED_SYRINGE | INTRAVENOUS | Status: DC | PRN
  Administered 2020-08-13: 100 mg via INTRAVENOUS

## 2020-08-13 MED ORDER — KETOROLAC TROMETHAMINE 30 MG/ML IJ SOLN
INTRAMUSCULAR | Status: AC
  Filled 2020-08-13: qty 1

## 2020-08-13 MED ORDER — MIDAZOLAM HCL 2 MG/2ML IJ SOLN
INTRAMUSCULAR | Status: AC
  Filled 2020-08-13: qty 2

## 2020-08-13 MED ORDER — BELLADONNA ALKALOIDS-OPIUM 16.2-60 MG RE SUPP
RECTAL | Status: DC | PRN
  Administered 2020-08-13: 1 via RECTAL

## 2020-08-13 MED ORDER — CIPROFLOXACIN HCL 500 MG PO TABS: 500.0000 mg | ORAL_TABLET | Freq: Two times a day (BID) | ORAL | 0 refills | Status: DC

## 2020-08-13 MED ORDER — MIDAZOLAM HCL 2 MG/2ML IJ SOLN
INTRAMUSCULAR | Status: DC | PRN
  Administered 2020-08-13: 2 mg via INTRAVENOUS

## 2020-08-13 MED ORDER — BELLADONNA ALKALOIDS-OPIUM 16.2-60 MG RE SUPP
RECTAL | Status: AC
  Filled 2020-08-13: qty 1

## 2020-08-13 MED ORDER — LACTATED RINGERS IV SOLN: INTRAVENOUS | Status: DC

## 2020-08-13 SURGICAL SUPPLY — 23 items
BAG DRAIN CYSTO-URO LG1000N (MISCELLANEOUS) ×3 IMPLANT
CNTNR SPEC 2.5X3XGRAD LEK (MISCELLANEOUS)
CONT SPEC 4OZ STER OR WHT (MISCELLANEOUS)
CONT SPEC 4OZ STRL OR WHT (MISCELLANEOUS)
CONTAINER SPEC 2.5X3XGRAD LEK (MISCELLANEOUS) IMPLANT
FIBER LASER FLEXIVA 1000 (UROLOGICAL SUPPLIES) ×2 IMPLANT
FIBER LASER FLEXIVA 550 (UROLOGICAL SUPPLIES) IMPLANT
GLOVE BIO SURGEON STRL SZ7.5 (GLOVE) ×3 IMPLANT
GOWN STRL REUS W/ TWL LRG LVL4 (GOWN DISPOSABLE) ×1 IMPLANT
GOWN STRL REUS W/ TWL XL LVL3 (GOWN DISPOSABLE) ×1 IMPLANT
GOWN STRL REUS W/TWL LRG LVL4 (GOWN DISPOSABLE) ×3
GOWN STRL REUS W/TWL XL LVL3 (GOWN DISPOSABLE) ×3
KIT TURNOVER CYSTO (KITS) ×3 IMPLANT
MANIFOLD NEPTUNE II (INSTRUMENTS) ×3 IMPLANT
PACK CYSTO AR (MISCELLANEOUS) ×3 IMPLANT
SET IRRIG Y TYPE TUR BLADDER L (SET/KITS/TRAYS/PACK) ×3 IMPLANT
SOL .9 NS 3000ML IRR  AL (IV SOLUTION) ×2
SOL .9 NS 3000ML IRR AL (IV SOLUTION) ×1
SOL .9 NS 3000ML IRR UROMATIC (IV SOLUTION) ×1 IMPLANT
SYR TOOMEY IRRIG 70ML (MISCELLANEOUS) ×3
SYRINGE TOOMEY IRRIG 70ML (MISCELLANEOUS) ×1 IMPLANT
WATER STERILE IRR 1000ML POUR (IV SOLUTION) ×3 IMPLANT
WATER STERILE IRR 3000ML UROMA (IV SOLUTION) ×3 IMPLANT

## 2020-08-13 NOTE — Op Note (Addendum)
Preoperative diagnosis: Bladder stone  Postoperative diagnosis: 1.  Bladder stone (N21.0)                                              2.  UroLift tab extrusion bladder neck (T19.1XXA)  Procedure: 1.  Holmium laser lithotripsy bladder stone (CPT 52318)                      2.  Removal of UroLift tab (CPT 75916)  Surgeon: Otelia Limes. Yves Dill MD  Anesthesia: General  Indications:See the history and physical. After informed consent the above procedure(s) were requested     Technique and findings: After adequate general anesthesia been obtained the patient was placed into dorsal lithotomy position and the perineum was prepped and draped in usual fashion.  The 3 French scope was coupled the camera and visually advanced into the bladder.  The prostatic fossa was widely patent.  No bladder tumors were identified.  A 15 mm calculus was located on the right bladder neck.  The 1000 m holmium laser fiber was passed to the bladder and set at frequency of 10 and power setting of 0.5.  The stone was fully disintegrated into fragments smaller than 1 mm.  At the basis of the stone a UroLift tab with attached suture was encountered.  The suture was cut with the endoscopic scissors and the tab and suture were removed with alligator forceps.  Bladder calculi were irrigated out of the bladder with a Toomey syringe.  Inspection of the bladder did not reveal any fragments present.  At this point the cystoscope was removed and 10 cc of viscous Xylocaine instilled within the urethra.  A B&O suppository was placed.  The procedure was then terminated and patient transferred to the recovery room in stable condition.

## 2020-08-13 NOTE — Anesthesia Procedure Notes (Signed)
Procedure Name: LMA Insertion Performed by: Gaynelle Cage, CRNA Pre-anesthesia Checklist: Patient identified, Emergency Drugs available, Suction available and Patient being monitored Patient Re-evaluated:Patient Re-evaluated prior to induction Oxygen Delivery Method: Circle system utilized Preoxygenation: Pre-oxygenation with 100% oxygen Induction Type: IV induction Ventilation: Mask ventilation without difficulty LMA: LMA inserted LMA Size: 5.0 Tube type: Oral Number of attempts: 1 Placement Confirmation: positive ETCO2 and breath sounds checked- equal and bilateral Dental Injury: Teeth and Oropharynx as per pre-operative assessment

## 2020-08-13 NOTE — Discharge Instructions (Addendum)
Bladder Stone  A bladder stone is a buildup of crystals made from the proteins and minerals found in urine. These substances build up when urine becomes too concentrated. Urine is concentrated when there is less water and more proteins and minerals in it. Bladder stones usually develop when a person has another medical condition that prevents the bladder from emptying completely. Crystals can form in the small amount of urine that is left in the bladder. Bladder stones that grow large can become painful and may block the flow of urine. What are the causes? This condition may be caused by:  An enlarged prostate, which prevents the bladder from emptying well.  An infection of a part of your urinary system (urinary tract infection, or UTI). This includes the: ? Kidneys. ? Bladder. ? Ureters. These are the tubes that carry urine to your bladder. ? Urethra. This is the tube that drains urine from your bladder.  A weak spot in the bladder that creates a small pouch (bladder diverticulum).  Nerve damage that may interfere with the signals from your brain to your bladder muscles (neurogenic bladder). This can result from conditions such as Parkinson's disease or spinal cord injuries. What increases the risk? This condition is more likely to develop in people who:  Get frequent UTIs.  Have another medical condition that affects the bladder.  Have a history of bladder surgery.  Have a spinal cord injury.  Have an abnormal shape of the bladder (deformity). What are the signs or symptoms? Common symptoms of this condition include:  Pain in the abdomen.  A need to urinate more often.  Difficulty or pain when urinating.  Blood in the urine.  Cloudy urine or urine that is dark in color.  Pain in the penis or testicles in men. Small bladder stones do not always cause symptoms. How is this diagnosed? This condition may be diagnosed based on your symptoms, medical history, and physical  exam. The physical exam will check for tenderness in your abdomen. For men, an exam in the rectum may be done to check the prostate gland.  You may have tests, such as: ? A urine test (urinalysis). ? A urine sample test to check for other infections (culture). ? Blood tests, including tests to look for a certain substance (creatinine). A creatinine level that is higher than normal could indicate a blockage. ? A procedure to check your bladder using a scope with a camera (cystoscopy).  You may also have imaging studies, such as: ? CT scan or ultrasound of your abdomen and the area between your hip bones (pelvis or pelvic area). ? An X-ray of your urinary system. How is this treated? This condition may be treated with:  Cystolitholapaxy. This procedure uses a laser, ultrasound, or other device to break the stone into smaller pieces. Fluids are used to flush the small pieces from the area.  Surgery to remove the stone.  A stent. This is a small mesh tube that is threaded into your ureter to make urine flow.  Medicines to treat pain. Follow these instructions at home: Medicines  Take over-the-counter and prescription medicines only as told by your health care provider.  Ask your health care provider if the medicine prescribed to you: ? Requires you to avoid driving or using heavy machinery. ? Can cause constipation. You may need to take these actions to prevent or treat constipation:  Take over-the-counter or prescription medicines.  Eat foods that are high in fiber, such as beans, whole  grains, and fresh fruits and vegetables.  Limit foods that are high in fat and processed sugars, such as fried or sweet foods. Alcohol use  Do not drink alcohol if: ? Your health care provider tells you not to drink. ? You are pregnant, may be pregnant, or are planning to become pregnant.  If you drink alcohol: ? Limit how much you drink to:  0-1 drink a day for women.  0-2 drinks a day for  men. ? Be aware of how much alcohol is in your drink. In the [place] one drink equals one 12 oz bottle of beer (355 mL), one 5 oz glass of wine (148 mL), or one 1 oz glass of hard liquor (44 mL). Activity  Rest as told by your health care provider.  Return to your normal activities as told by your health care provider. Ask your health care provider what activities are safe for you. General instructions   Drink enough fluid to keep your urine pale yellow.  Tell your health care provider about any unusual symptoms related to urinating. Early diagnosis of an enlarged prostate and other bladder conditions may reduce your risk of getting bladder stones.  Do not use any products that contain nicotine or tobacco, such as cigarettes, e-cigarettes, or chewing tobacco. If you need help quitting, ask your health care provider.  Do not use drugs. Where to find more information [facility]): www.urologyhealth.org Contact a health care provider if you:  Have a fever.  Feel nauseous or vomit.  Are unable to urinate.  Have a large amount of blood in your urine. Get help right away if you:  Have severe back pain or pain in the lower part of your abdomen.  Cannot eat or drink without vomiting.  Vomit after taking your medicine. Summary  A bladder stone is a buildup of crystals made from the proteins and minerals found in urine. These substances build up when urine becomes too concentrated.  Bladder stones that grow large can become painful and may block the flow of urine.  Bladder stones may be treated with a laser, a stent, surgery, or pain medicines. This information is not intended to replace advice given to you by your health care provider. Make sure you discuss any questions you have with your health care provider.  AMBULATORY SURGERY  DISCHARGE INSTRUCTIONS   1) The drugs that you were given will stay in your system until tomorrow so for the next 24 hours you  should not:  A) Drive an automobile B) Make any legal decisions C) Drink any alcoholic beverage   2) You may resume regular meals tomorrow.  Today it is better to start with liquids and gradually work up to solid foods.  You may eat anything you prefer, but it is better to start with liquids, then soup and crackers, and gradually work up to solid foods.   3) Please notify your doctor immediately if you have any unusual bleeding, trouble breathing, redness and pain at the surgery site, drainage, fever, or pain not relieved by medication.    4) Additional Instructions:  Please contact your physician with any problems or Same Day Surgery at [phone], Monday through Friday 6 am to 4 pm, or [place] at [facility] number at [phone].

## 2020-08-13 NOTE — H&P (Signed)
Date of Initial H&P:08/06/20  History reviewed, patient examined, no change in status, stable for surgery.

## 2020-08-13 NOTE — Anesthesia Preprocedure Evaluation (Signed)
Anesthesia Evaluation  Patient identified by MRN, date of birth, ID band Patient awake    Reviewed: Allergy & Precautions, H&P , NPO status , Patient's Chart, lab work & pertinent test results, reviewed documented beta blocker date and time   Airway Mallampati: II  TM Distance: >3 FB Neck ROM: full    Dental  (+) Teeth Intact   Pulmonary neg pulmonary ROS, former smoker,    Pulmonary exam normal        Cardiovascular Exercise Tolerance: Good negative cardio ROS Normal cardiovascular exam Rate:Normal     Neuro/Psych  Neuromuscular disease negative psych ROS   GI/Hepatic Neg liver ROS, hiatal hernia, GERD  Medicated,  Endo/Other  negative endocrine ROS  Renal/GU negative Renal ROS  negative genitourinary   Musculoskeletal   Abdominal   Peds  Hematology negative hematology ROS (+)   Anesthesia Other Findings   Reproductive/Obstetrics negative OB ROS                             Anesthesia Physical Anesthesia Plan  ASA: II  Anesthesia Plan: General LMA   Post-op Pain Management:    Induction:   PONV Risk Score and Plan:   Airway Management Planned:   Additional Equipment:   Intra-op Plan:   Post-operative Plan:   Informed Consent: I have reviewed the patients History and Physical, chart, labs and discussed the procedure including the risks, benefits and alternatives for the proposed anesthesia with the patient or authorized representative who has indicated his/her understanding and acceptance.       Plan Discussed with: CRNA  Anesthesia Plan Comments:         Anesthesia Quick Evaluation

## 2020-08-13 NOTE — Transfer of Care (Signed)
Immediate Anesthesia Transfer of Care Note  Patient: Francisco Braun  Procedure(s) Performed: CYSTOSCOPY WITH LITHOLAPAXY WITH  HOLIUM LASER (N/A )  Patient Location: PACU  Anesthesia Type:General  Level of Consciousness: awake  Airway & Oxygen Therapy: Patient Spontanous Breathing and Patient connected to face mask oxygen  Post-op Assessment: Report given to RN  Post vital signs: Reviewed and stable  Last Vitals:  Vitals Value Taken Time  BP 136/84 08/13/20 1543  Temp    Pulse 99 08/13/20 1545  Resp    SpO2 100 % 08/13/20 1545  Vitals shown include unvalidated device data.  Last Pain:  Vitals:   08/13/20 1352  TempSrc: Tympanic  PainSc: 0-No pain         Complications: No complications documented.

## 2020-08-14 ENCOUNTER — Encounter: Payer: Self-pay | Admitting: Urology

## 2020-08-14 ENCOUNTER — Other Ambulatory Visit: Payer: Self-pay | Admitting: Neurology

## 2020-08-17 NOTE — Anesthesia Postprocedure Evaluation (Signed)
Anesthesia Post Note  Patient: Francisco Braun  Procedure(s) Performed: CYSTOSCOPY WITH LITHOLAPAXY WITH  HOLIUM LASER (N/A )  Patient location during evaluation: PACU Anesthesia Type: General Level of consciousness: awake and alert Pain management: pain level controlled Vital Signs Assessment: post-procedure vital signs reviewed and stable Respiratory status: spontaneous breathing, nonlabored ventilation, respiratory function stable and patient connected to nasal cannula oxygen Cardiovascular status: blood pressure returned to baseline and stable Postop Assessment: no apparent nausea or vomiting Anesthetic complications: no   No complications documented.   Last Vitals:  Vitals:   08/13/20 1615 08/13/20 1630  BP:  (!) 145/76  Pulse:  66  Resp:  16  Temp: (!) 36.2 C (!) 36.1 C  SpO2:  98%    Last Pain:  Vitals:   08/14/20 0806  TempSrc:   PainSc: 0-No pain                 Molli Barrows

## 2020-12-26 ENCOUNTER — Other Ambulatory Visit: Payer: Self-pay | Admitting: Neurology

## 2021-01-21 ENCOUNTER — Telehealth: Payer: Self-pay | Admitting: Neurology

## 2021-01-21 MED ORDER — MYCOPHENOLATE MOFETIL 500 MG PO TABS
ORAL_TABLET | ORAL | 1 refills | Status: DC
Start: 2021-01-21 — End: 2021-02-24

## 2021-01-21 NOTE — Telephone Encounter (Signed)
Pt called and insisted that the RN called him to discuss his medications and appointments. Please advise.

## 2021-01-21 NOTE — Telephone Encounter (Signed)
I spoke to the patient. He needs a refill on his Cellcept. He is past due on his follow up. Scheduled an appt while on the phone today. Refill sent to pharmacy.

## 2021-02-18 ENCOUNTER — Other Ambulatory Visit: Payer: Self-pay | Admitting: Neurology

## 2021-02-24 ENCOUNTER — Ambulatory Visit: Payer: BC Managed Care – PPO | Admitting: Neurology

## 2021-02-24 ENCOUNTER — Encounter: Payer: Self-pay | Admitting: Neurology

## 2021-02-24 ENCOUNTER — Other Ambulatory Visit: Payer: Self-pay

## 2021-02-24 VITALS — BP 110/68 | HR 75 | Ht 70.0 in | Wt 169.5 lb

## 2021-02-24 DIAGNOSIS — G7000 Myasthenia gravis without (acute) exacerbation: Secondary | ICD-10-CM

## 2021-02-24 MED ORDER — MYCOPHENOLATE MOFETIL 500 MG PO TABS: ORAL_TABLET | ORAL | 4 refills | Status: DC
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
## 2021-02-25 ENCOUNTER — Telehealth: Payer: Self-pay | Admitting: *Deleted

## 2021-02-25 LAB — COMPREHENSIVE METABOLIC PANEL
ALT: 13 IU/L (ref 0–44)
AST: 18 IU/L (ref 0–40)
Albumin/Globulin Ratio: 1.9 (ref 1.2–2.2)
Albumin: 4.3 g/dL (ref 3.8–4.8)
Alkaline Phosphatase: 102 IU/L (ref 44–121)
BUN/Creatinine Ratio: 23 (ref 10–24)
BUN: 16 mg/dL (ref 8–27)
Bilirubin Total: 0.3 mg/dL (ref 0.0–1.2)
CO2: 21 mmol/L (ref 20–29)
Calcium: 8.6 mg/dL (ref 8.6–10.2)
Chloride: 105 mmol/L (ref 96–106)
Creatinine, Ser: 0.69 mg/dL — ABNORMAL LOW (ref 0.76–1.27)
Globulin, Total: 2.3 g/dL (ref 1.5–4.5)
Glucose: 87 mg/dL (ref 65–99)
Potassium: 4.4 mmol/L (ref 3.5–5.2)
Sodium: 140 mmol/L (ref 134–144)
Total Protein: 6.6 g/dL (ref 6.0–8.5)
eGFR: 101 mL/min/{1.73_m2} (ref >59)

## 2021-02-25 LAB — CBC WITH DIFFERENTIAL/PLATELET
Basophils Absolute: 0 10*3/uL (ref 0.0–0.2)
Basos: 0 %
EOS (ABSOLUTE): 0.4 10*3/uL (ref 0.0–0.4)
Eos: 5 %
Hematocrit: 42.3 % (ref 37.5–51.0)
Hemoglobin: 14.7 g/dL (ref 13.0–17.7)
Immature Grans (Abs): 0 10*3/uL (ref 0.0–0.1)
Immature Granulocytes: 1 %
Lymphocytes Absolute: 1.4 10*3/uL (ref 0.7–3.1)
Lymphs: 17 %
MCH: 30.9 pg (ref 26.6–33.0)
MCHC: 34.8 g/dL (ref 31.5–35.7)
MCV: 89 fL (ref 79–97)
Monocytes Absolute: 1 10*3/uL — ABNORMAL HIGH (ref 0.1–0.9)
Monocytes: 12 %
Neutrophils Absolute: 5.5 10*3/uL (ref 1.4–7.0)
Neutrophils: 65 %
Platelets: 232 10*3/uL (ref 150–450)
RBC: 4.76 x10E6/uL (ref 4.14–5.80)
RDW: 13.7 % (ref 11.6–15.4)
WBC: 8.3 10*3/uL (ref 3.4–10.8)

## 2021-02-25 LAB — TSH: TSH: 0.886 u[IU]/mL (ref 0.450–4.500)

## 2021-02-25 NOTE — Telephone Encounter (Signed)
-----   Message from Marcial Pacas, MD sent at 02/25/2021 10:25 AM EDT ----- Please call patient for no significant abnormalities on laboratory evaluations.

## 2021-02-25 NOTE — Telephone Encounter (Signed)
Left patient a detailed message, with results, on voicemail (ok per DPR).  Provided our number to call back with any questions.  

## 2021-12-04 IMAGING — CT CT ABD-PEL WO/W CM
4 of 15 series · 11 of 46 positions shown, 16 images · IV contrast (APPLIED)
Comparison: None

CLINICAL DATA: Microscopic hematuria.

EXAM:
CT ABDOMEN AND PELVIS WITHOUT AND WITH CONTRAST
TECHNIQUE: Multidetector CT imaging of the abdomen and pelvis was performed
following the standard protocol before and following the bolus
administration of intravenous contrast.
CONTRAST:  125mL OMNIPAQUE IOHEXOL 300 MG/ML  SOLN

[Series 12: axial pre (person_name) (person_name) · axial · non-contrast · 0.77mm/px · z∈[-855,-605]mm · 3 of 102 slices shown, 7 images]
[im 26/102  soft-tissue]
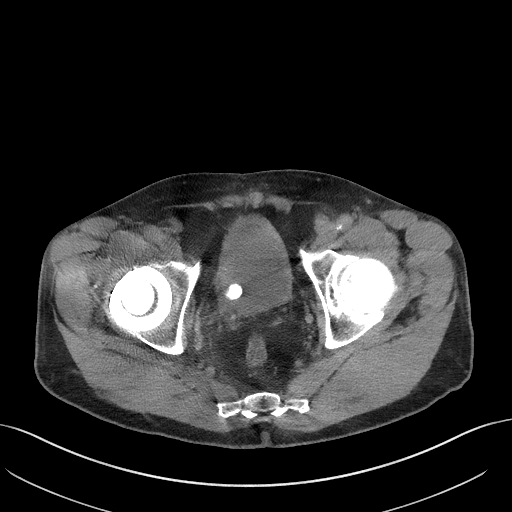
[im 26/102  lung]
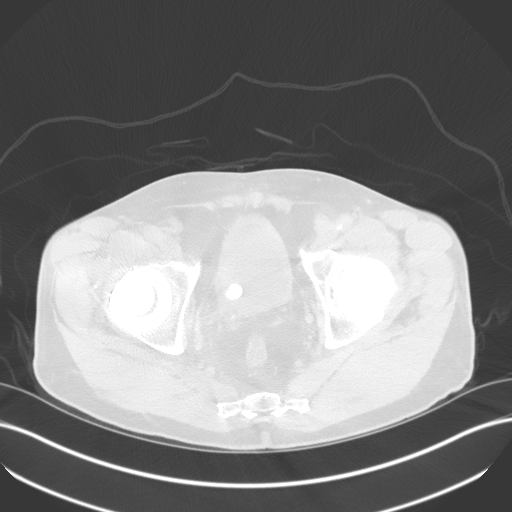
[im 26/102  bone]
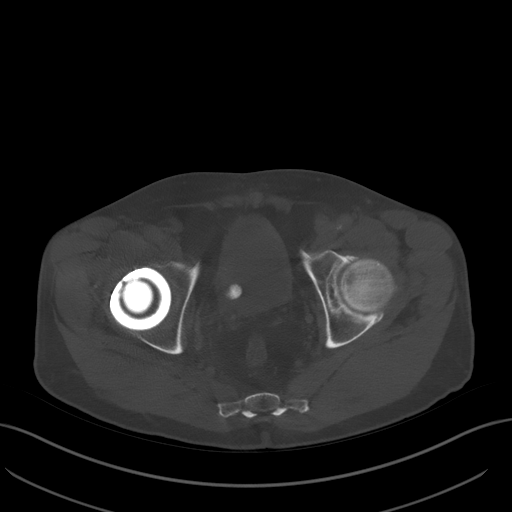
[im 51/102  soft-tissue]
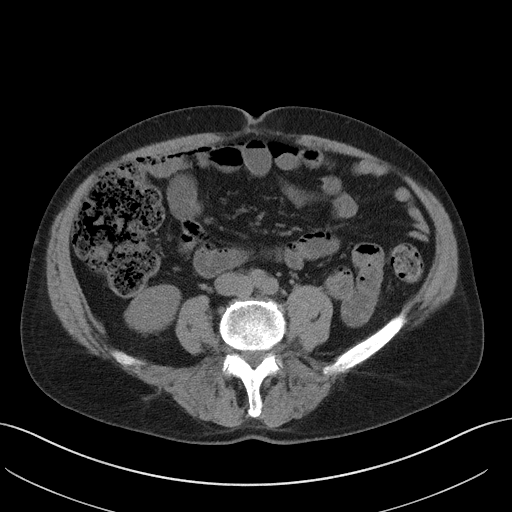
[im 51/102  lung]
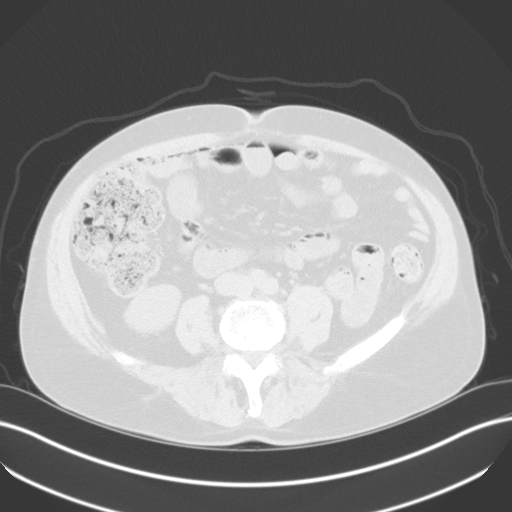
[im 76/102  soft-tissue]
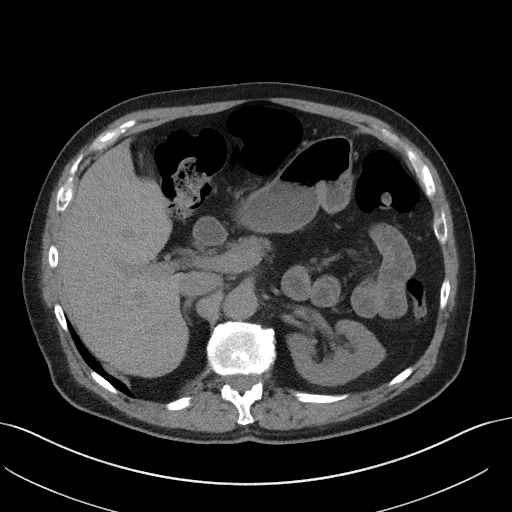
[im 76/102  lung]
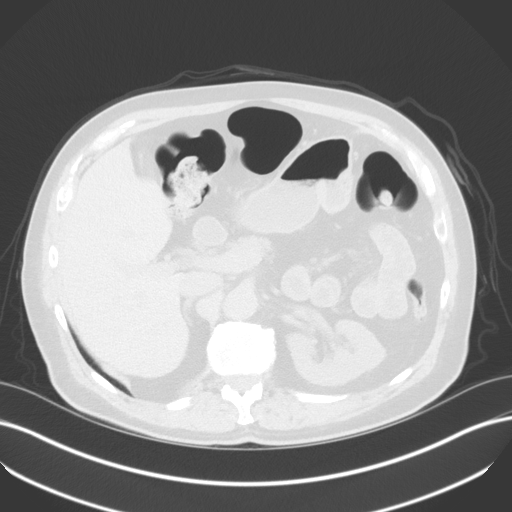

[Series 13: axial (person_name) (person_name) · axial · 0.78mm/px · z∈[-855,-605]mm · 3 of 102 slices shown]
[im 26/102  soft-tissue]
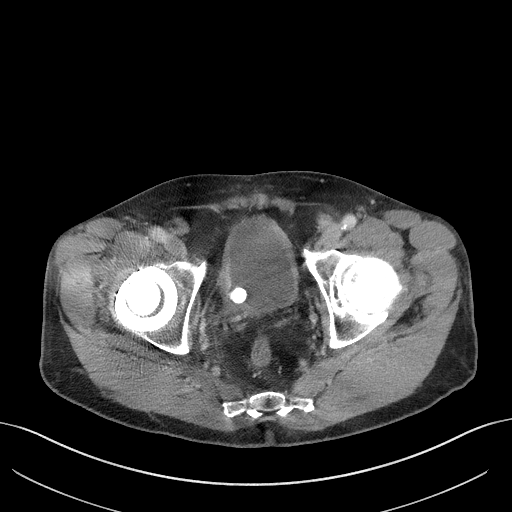
[im 51/102  soft-tissue]
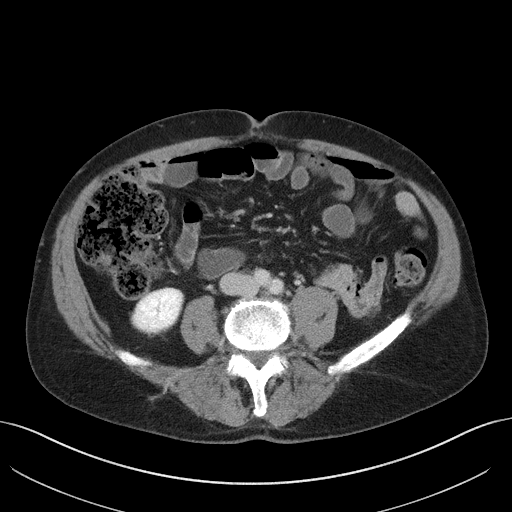
[im 76/102  soft-tissue]
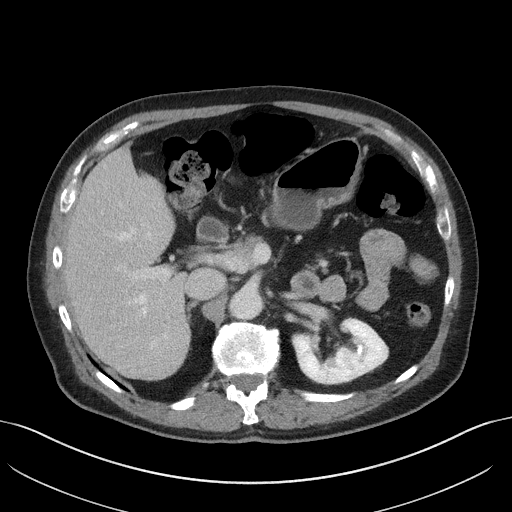

[Series 15: axial delay · axial · delayed · 0.87mm/px · z∈[-968,-718]mm · 3 of 102 slices shown]
[im 26/102  soft-tissue]
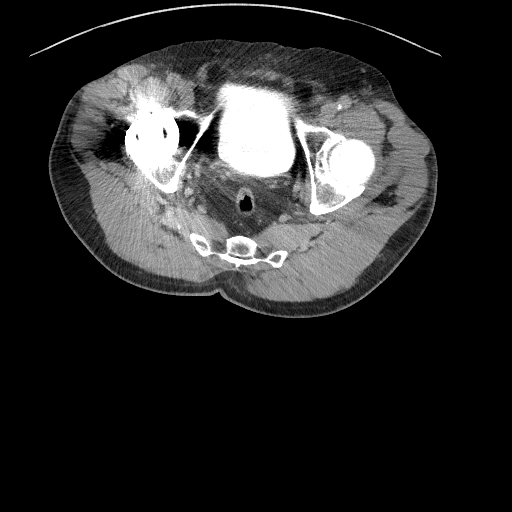
[im 51/102  soft-tissue]
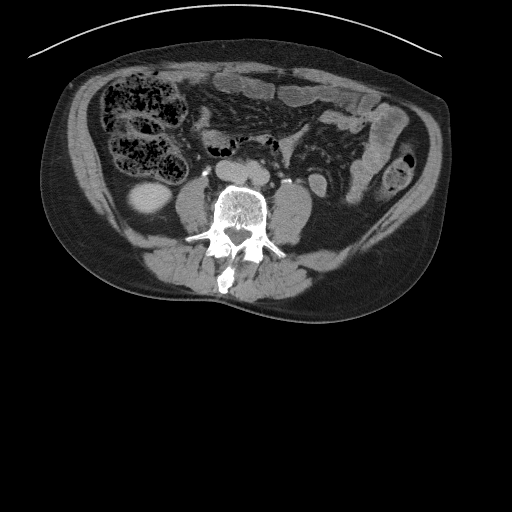
[im 76/102  soft-tissue]
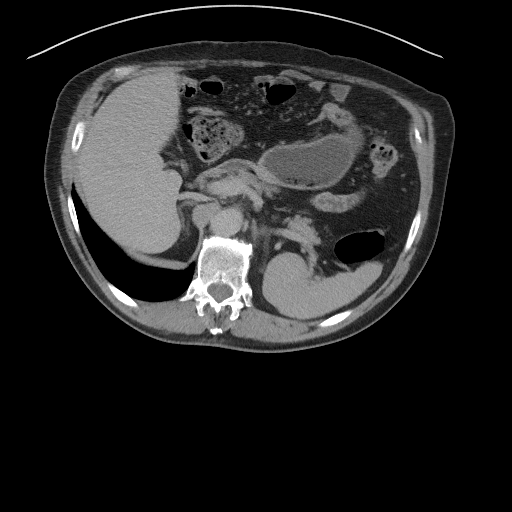

[Series 18: coronal delay · coronal · delayed · 0.79mm/px · 2 of 104 slices shown, 3 images]
[im 35/104  soft-tissue]
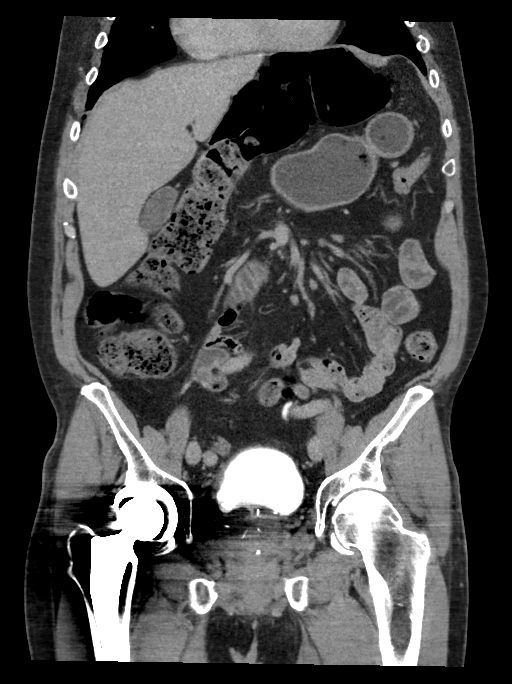
[im 35/104  bone]
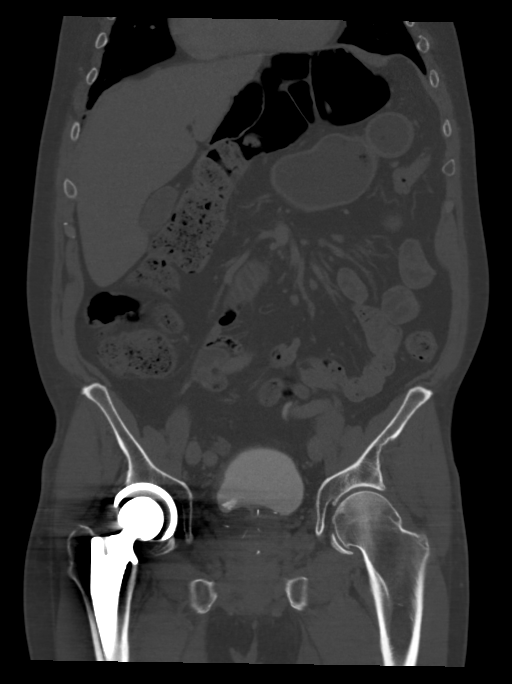
[im 69/104  soft-tissue]
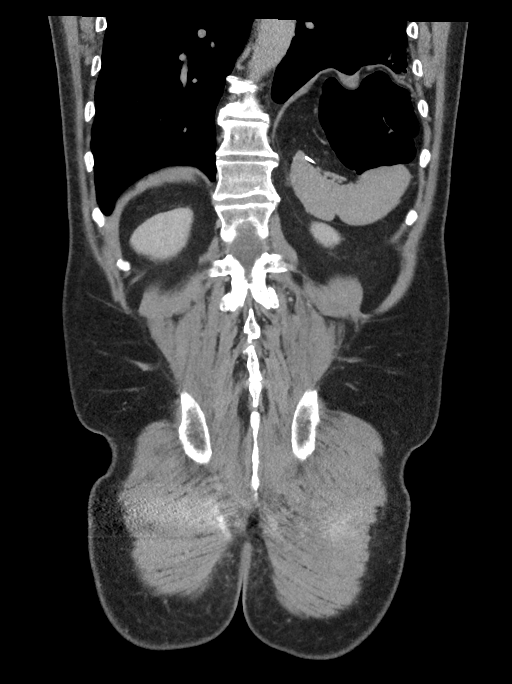

[11 of 46 positions shown; findings below may reference images not displayed]

FINDINGS: Lower chest: Eventration of the left hemidiaphragm noted. There is
adjacent scarring and volume loss within the posterolateral left
lower lobe.

Hepatobiliary: Gallbladder appears normal. Tiny kidney stones
measure up to 4 mm. No signs of gallbladder wall thickening or
inflammation. No bile duct dilatation.

Pancreas: Unremarkable. No pancreatic ductal dilatation or
surrounding inflammatory changes.

Spleen: Normal in size without focal abnormality.

Adrenals/Urinary Tract: Normal appearance of the adrenal glands. No
kidney stones identified bilaterally. Small hypodensity within the
inferior pole of right kidney measures 4 mm and is too small to
reliably characterize. Left kidney appears normal. Evaluation of the
urinary bladder is diminished by streak artifact from right hip
arthroplasty device. There is a stone within the bladder adjacent to
the UVJ which measures 1.2 cm, image 77/4. Asymmetric bladder wall
thickening and enhancement measures up to 7 mm in thickness.

Stomach/Bowel: Postoperative changes from hiatal hernia repair.
Stomach is nondistended. Prominent lower abdominal small bowel loops
measure up to 2.6 cm with scattered air-fluid levels. The distal
small bowel loops are nondilated. Moderate stool burden identified
within the colon.

Vascular/Lymphatic: Aortic atherosclerosis. No aneurysm. No
abdominopelvic adenopathy.

Reproductive: Prostate gland appears enlarged.

Other: No free fluid or fluid collections. Small midline, ventral
abdominal wall hernia containing a 2 loops of small bowel, one of
which is mildly dilated and the other is decreased in caliber.

Musculoskeletal: Degenerative disc disease identified within the
lumbar spine. No acute or suspicious osseous findings.
IMPRESSION: 1. There is a 1.2 cm stone within the bladder adjacent to the right
UVJ. Nonspecific, asymmetric bladder wall thickening and enhancement
noted. Consider direct visualization to rule out underlying
urothelial lesion.
2. Prominent lower abdominal small bowel loops with scattered
air-fluid levels measure up to 2.6 cm. There is a transition to
decreased caliber distal small bowel loops. Additionally, there is a
midline ventral abdominal wall hernia which contains loops of small
bowel. Recommend correlation for any obstructive like symptoms.
3. Aortic atherosclerosis.

Aortic Atherosclerosis ([id number].[email or web address]).

## 2022-01-20 ENCOUNTER — Encounter: Payer: Self-pay | Admitting: Neurology

## 2022-02-28 ENCOUNTER — Ambulatory Visit: Payer: BC Managed Care – PPO | Admitting: Neurology

## 2022-04-25 ENCOUNTER — Ambulatory Visit: Payer: BC Managed Care – PPO | Admitting: Neurology

## 2022-04-25 ENCOUNTER — Encounter: Payer: Self-pay | Admitting: Neurology

## 2022-04-25 VITALS — BP 130/80 | HR 64 | Ht 70.0 in | Wt 172.5 lb

## 2022-04-25 DIAGNOSIS — G7000 Myasthenia gravis without (acute) exacerbation: Secondary | ICD-10-CM | POA: Diagnosis not present

## 2022-04-25 MED ORDER — MYCOPHENOLATE MOFETIL 500 MG PO TABS: 750.0000 mg | ORAL_TABLET | Freq: Two times a day (BID) | ORAL | 3 refills | Status: DC

## 2022-04-25 MED ORDER — PYRIDOSTIGMINE BROMIDE 60 MG PO TABS: 60.0000 mg | ORAL_TABLET | Freq: Three times a day (TID) | ORAL | 6 refills | Status: AC | PRN
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
## 2022-04-26 ENCOUNTER — Telehealth: Payer: Self-pay

## 2022-04-26 LAB — COMPREHENSIVE METABOLIC PANEL
ALT: 14 IU/L (ref 0–44)
AST: 18 IU/L (ref 0–40)
Albumin/Globulin Ratio: 2 (ref 1.2–2.2)
Albumin: 4.3 g/dL (ref 3.9–4.9)
Alkaline Phosphatase: 93 IU/L (ref 44–121)
BUN/Creatinine Ratio: 10 (ref 10–24)
BUN: 10 mg/dL (ref 8–27)
Bilirubin Total: 0.4 mg/dL (ref 0.0–1.2)
CO2: 22 mmol/L (ref 20–29)
Calcium: 9 mg/dL (ref 8.6–10.2)
Chloride: 106 mmol/L (ref 96–106)
Creatinine, Ser: 0.98 mg/dL (ref 0.76–1.27)
Globulin, Total: 2.1 g/dL (ref 1.5–4.5)
Glucose: 86 mg/dL (ref 70–99)
Potassium: 4.4 mmol/L (ref 3.5–5.2)
Sodium: 140 mmol/L (ref 134–144)
Total Protein: 6.4 g/dL (ref 6.0–8.5)
eGFR: 83 mL/min/{1.73_m2} (ref >59)

## 2022-04-26 LAB — CBC WITH DIFFERENTIAL
Basophils Absolute: 0 10*3/uL (ref 0.0–0.2)
Basos: 0 %
EOS (ABSOLUTE): 0.2 10*3/uL (ref 0.0–0.4)
Eos: 3 %
Hematocrit: 46 % (ref 37.5–51.0)
Hemoglobin: 15 g/dL (ref 13.0–17.7)
Immature Grans (Abs): 0 10*3/uL (ref 0.0–0.1)
Immature Granulocytes: 0 %
Lymphocytes Absolute: 1.2 10*3/uL (ref 0.7–3.1)
Lymphs: 17 %
MCH: 30.2 pg (ref 26.6–33.0)
MCHC: 32.6 g/dL (ref 31.5–35.7)
MCV: 93 fL (ref 79–97)
Monocytes Absolute: 0.9 10*3/uL (ref 0.1–0.9)
Monocytes: 13 %
Neutrophils Absolute: 4.7 10*3/uL (ref 1.4–7.0)
Neutrophils: 67 %
RBC: 4.96 x10E6/uL (ref 4.14–5.80)
RDW: 13.4 % (ref 11.6–15.4)
WBC: 7 10*3/uL (ref 3.4–10.8)

## 2022-04-26 LAB — TSH: TSH: 1.03 u[IU]/mL (ref 0.450–4.500)

## 2022-04-26 NOTE — Telephone Encounter (Signed)
I called pt and updated of lab results. He verbalized understanding and appreciation of the call.

## 2022-04-26 NOTE — Telephone Encounter (Signed)
-----   Message from Marcial Pacas, MD sent at 04/26/2022  8:39 AM EDT ----- Please call patient, laboratory evaluation showed no significant abnormalities.

## 2022-05-16 ENCOUNTER — Telehealth: Payer: Self-pay | Admitting: Neurology

## 2022-05-16 MED ORDER — MYCOPHENOLATE MOFETIL 500 MG PO TABS: 1000.0000 mg | ORAL_TABLET | Freq: Two times a day (BID) | ORAL | 3 refills | Status: DC

## 2022-05-16 NOTE — Telephone Encounter (Signed)
Meds ordered this encounter  Medications   mycophenolate (CELLCEPT) 500 MG tablet    Sig: Take 2 tablets (1,000 mg total) by mouth 2 (two) times daily.    Dispense:  360 tablet    Refill:  3    Please discard previous order of Cellcept '500mg'$  1.5 tabs bid.  Agree to go up on CellCept to previous dosage 500 mg 2 tablets twice a day instead of 1 and half tablets twice a day  I have sent in new prescription to reflect the change

## 2022-05-16 NOTE — Telephone Encounter (Signed)
I returned the pt's call last week after changing his CellCept to 500 mg 1.5 tabs twice per day he had an episode where his speech was grabbled, felt week, and had to take a PRN Mestinon. This took place on 05/13/2022.  He slept for several hours and then woke up feeling better. He had been up most of the night the day before this episode.  He reports since this happened he has resumed taking 2 tablets twice a day and has felt better.  Would like to continue this dosage if MD agrees.

## 2022-05-16 NOTE — Telephone Encounter (Signed)
Pt is calling. Stated he need to talk to nurse because Dr. Krista Blue took him off some medication. Pt stated he put himself back on medication and needs to talk to someone.

## 2022-05-17 NOTE — Telephone Encounter (Signed)
I called the pt back and we discussed Dr. Greer Pickerel agreement to the plan. Advised going forward pharmacy will be able to fill the correct dosage for his CellCept. He verbalized understanding and appreciation for the call.

## 2023-04-24 ENCOUNTER — Ambulatory Visit (INDEPENDENT_AMBULATORY_CARE_PROVIDER_SITE_OTHER): Payer: BC Managed Care – PPO | Admitting: Neurology

## 2023-04-24 ENCOUNTER — Encounter: Payer: Self-pay | Admitting: Neurology

## 2023-04-24 VITALS — BP 124/76 | Ht 72.0 in | Wt 175.0 lb

## 2023-04-24 DIAGNOSIS — G7000 Myasthenia gravis without (acute) exacerbation: Secondary | ICD-10-CM | POA: Diagnosis not present
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
## 2023-04-25 LAB — COMPREHENSIVE METABOLIC PANEL
ALT: 14 IU/L (ref 0–44)
AST: 17 IU/L (ref 0–40)
Albumin: 4.2 g/dL (ref 3.9–4.9)
Alkaline Phosphatase: 96 IU/L (ref 44–121)
BUN/Creatinine Ratio: 8 — ABNORMAL LOW (ref 10–24)
BUN: 7 mg/dL — ABNORMAL LOW (ref 8–27)
Bilirubin Total: 0.4 mg/dL (ref 0.0–1.2)
CO2: 24 mmol/L (ref 20–29)
Calcium: 9.2 mg/dL (ref 8.6–10.2)
Chloride: 105 mmol/L (ref 96–106)
Creatinine, Ser: 0.84 mg/dL (ref 0.76–1.27)
Globulin, Total: 2.3 g/dL (ref 1.5–4.5)
Glucose: 95 mg/dL (ref 70–99)
Potassium: 4.6 mmol/L (ref 3.5–5.2)
Sodium: 139 mmol/L (ref 134–144)
Total Protein: 6.5 g/dL (ref 6.0–8.5)
eGFR: 94 mL/min/{1.73_m2} (ref >59)

## 2023-04-25 LAB — TSH: TSH: 1.55 u[IU]/mL (ref 0.450–4.500)

## 2023-07-14 ENCOUNTER — Other Ambulatory Visit: Payer: Self-pay | Admitting: Neurology

## 2024-04-23 ENCOUNTER — Ambulatory Visit: Payer: BC Managed Care – PPO | Admitting: Neurology

## 2024-04-29 ENCOUNTER — Encounter: Payer: Self-pay | Admitting: Neurology

## 2024-04-29 ENCOUNTER — Ambulatory Visit (INDEPENDENT_AMBULATORY_CARE_PROVIDER_SITE_OTHER): Payer: BC Managed Care – PPO | Admitting: Neurology

## 2024-04-29 VITALS — BP 130/74 | Ht 72.0 in | Wt 175.0 lb

## 2024-04-29 DIAGNOSIS — G7000 Myasthenia gravis without (acute) exacerbation: Secondary | ICD-10-CM | POA: Diagnosis not present

## 2024-04-29 MED ORDER — MYCOPHENOLATE MOFETIL 500 MG PO TABS: 750.0000 mg | ORAL_TABLET | Freq: Two times a day (BID) | ORAL | 3 refills | Status: AC
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
## 2024-04-30 ENCOUNTER — Ambulatory Visit: Payer: Self-pay | Admitting: Neurology

## 2024-04-30 LAB — CBC WITH DIFFERENTIAL/PLATELET
Basophils Absolute: 0 x10E3/uL (ref 0.0–0.2)
Basos: 1 %
EOS (ABSOLUTE): 0.3 x10E3/uL (ref 0.0–0.4)
Eos: 3 %
Hematocrit: 43.2 % (ref 37.5–51.0)
Hemoglobin: 14.4 g/dL (ref 13.0–17.7)
Immature Grans (Abs): 0 x10E3/uL (ref 0.0–0.1)
Immature Granulocytes: 0 %
Lymphocytes Absolute: 1.3 x10E3/uL (ref 0.7–3.1)
Lymphs: 16 %
MCH: 31.6 pg (ref 26.6–33.0)
MCHC: 33.3 g/dL (ref 31.5–35.7)
MCV: 95 fL (ref 79–97)
Monocytes Absolute: 1 x10E3/uL — ABNORMAL HIGH (ref 0.1–0.9)
Monocytes: 12 %
Neutrophils Absolute: 5.6 x10E3/uL (ref 1.4–7.0)
Neutrophils: 68 %
Platelets: 262 x10E3/uL (ref 150–450)
RBC: 4.55 x10E6/uL (ref 4.14–5.80)
RDW: 13.1 % (ref 11.6–15.4)
WBC: 8.3 x10E3/uL (ref 3.4–10.8)

## 2024-04-30 LAB — COMPREHENSIVE METABOLIC PANEL WITH GFR
ALT: 22 IU/L (ref 0–44)
AST: 26 IU/L (ref 0–40)
Albumin: 4.1 g/dL (ref 3.8–4.8)
Alkaline Phosphatase: 91 IU/L (ref 44–121)
BUN/Creatinine Ratio: 13 (ref 10–24)
BUN: 11 mg/dL (ref 8–27)
Bilirubin Total: 0.3 mg/dL (ref 0.0–1.2)
CO2: 20 mmol/L (ref 20–29)
Calcium: 9 mg/dL (ref 8.6–10.2)
Chloride: 106 mmol/L (ref 96–106)
Creatinine, Ser: 0.87 mg/dL (ref 0.76–1.27)
Globulin, Total: 2 g/dL (ref 1.5–4.5)
Glucose: 78 mg/dL (ref 70–99)
Potassium: 4.6 mmol/L (ref 3.5–5.2)
Sodium: 139 mmol/L (ref 134–144)
Total Protein: 6.1 g/dL (ref 6.0–8.5)
eGFR: 92 mL/min/1.73 (ref >59)

## 2024-04-30 LAB — TSH: TSH: 1.24 u[IU]/mL (ref 0.450–4.500)

## 2024-04-30 LAB — HGB A1C W/O EAG: Hgb A1c MFr Bld: 5.3 % (ref 4.8–5.6)

## 2025-05-05 ENCOUNTER — Ambulatory Visit: Admitting: Neurology
